# Patient Record
Sex: Female | Born: 1950 | Race: Black or African American | Hispanic: No | Marital: Married | Smoking: Never smoker
Health system: Southern US, Community
[De-identification: ages and names within clinical notes are randomized; demographics above are authoritative.]

## PROBLEM LIST (undated history)

## (undated) DIAGNOSIS — R229 Localized swelling, mass and lump, unspecified: Secondary | ICD-10-CM

## (undated) DIAGNOSIS — E785 Hyperlipidemia, unspecified: Secondary | ICD-10-CM

## (undated) DIAGNOSIS — T8859XA Other complications of anesthesia, initial encounter: Secondary | ICD-10-CM

## (undated) DIAGNOSIS — I10 Essential (primary) hypertension: Secondary | ICD-10-CM

## (undated) DIAGNOSIS — R112 Nausea with vomiting, unspecified: Secondary | ICD-10-CM

## (undated) DIAGNOSIS — R221 Localized swelling, mass and lump, neck: Secondary | ICD-10-CM

## (undated) DIAGNOSIS — T4145XA Adverse effect of unspecified anesthetic, initial encounter: Secondary | ICD-10-CM

## (undated) DIAGNOSIS — Z9889 Other specified postprocedural states: Secondary | ICD-10-CM

## (undated) HISTORY — PX: BREAST EXCISIONAL BIOPSY: SUR124

## (undated) HISTORY — PX: CATARACT EXTRACTION: SUR2

---

## 2001-05-17 ENCOUNTER — Other Ambulatory Visit: Admission: RE | Admit: 2001-05-17 | Discharge: 2001-05-17 | Payer: Self-pay | Admitting: Internal Medicine

## 2001-07-15 ENCOUNTER — Ambulatory Visit (HOSPITAL_COMMUNITY): Admission: RE | Admit: 2001-07-15 | Discharge: 2001-07-15 | Payer: Self-pay | Admitting: Gastroenterology

## 2001-07-15 ENCOUNTER — Encounter (INDEPENDENT_AMBULATORY_CARE_PROVIDER_SITE_OTHER): Payer: Self-pay | Admitting: *Deleted

## 2002-11-02 ENCOUNTER — Emergency Department (HOSPITAL_COMMUNITY): Admission: EM | Admit: 2002-11-02 | Discharge: 2002-11-02 | Payer: Self-pay | Admitting: Emergency Medicine

## 2004-11-24 HISTORY — PX: BUNIONECTOMY: SHX129

## 2005-01-12 ENCOUNTER — Emergency Department (HOSPITAL_COMMUNITY): Admission: EM | Admit: 2005-01-12 | Discharge: 2005-01-12 | Payer: Self-pay | Admitting: Emergency Medicine

## 2005-08-29 ENCOUNTER — Other Ambulatory Visit: Admission: RE | Admit: 2005-08-29 | Discharge: 2005-08-29 | Payer: Self-pay | Admitting: Obstetrics and Gynecology

## 2005-11-24 HISTORY — PX: BREAST LUMPECTOMY: SHX2

## 2006-01-12 ENCOUNTER — Encounter (INDEPENDENT_AMBULATORY_CARE_PROVIDER_SITE_OTHER): Payer: Self-pay | Admitting: Specialist

## 2006-01-12 ENCOUNTER — Encounter: Admission: RE | Admit: 2006-01-12 | Discharge: 2006-01-12 | Payer: Self-pay | Admitting: General Surgery

## 2006-01-12 ENCOUNTER — Ambulatory Visit (HOSPITAL_BASED_OUTPATIENT_CLINIC_OR_DEPARTMENT_OTHER): Admission: RE | Admit: 2006-01-12 | Discharge: 2006-01-12 | Payer: Self-pay | Admitting: General Surgery

## 2006-09-28 ENCOUNTER — Ambulatory Visit (HOSPITAL_COMMUNITY): Admission: RE | Admit: 2006-09-28 | Discharge: 2006-09-28 | Payer: Self-pay | Admitting: Gastroenterology

## 2006-09-28 ENCOUNTER — Encounter (INDEPENDENT_AMBULATORY_CARE_PROVIDER_SITE_OTHER): Payer: Self-pay | Admitting: Specialist

## 2007-05-03 ENCOUNTER — Other Ambulatory Visit: Admission: RE | Admit: 2007-05-03 | Discharge: 2007-05-03 | Payer: Self-pay | Admitting: *Deleted

## 2009-02-05 ENCOUNTER — Other Ambulatory Visit: Admission: RE | Admit: 2009-02-05 | Discharge: 2009-02-05 | Payer: Self-pay | Admitting: Obstetrics and Gynecology

## 2009-02-06 ENCOUNTER — Encounter: Admission: RE | Admit: 2009-02-06 | Discharge: 2009-02-06 | Payer: Self-pay | Admitting: Family Medicine

## 2010-03-12 ENCOUNTER — Encounter: Admission: RE | Admit: 2010-03-12 | Discharge: 2010-03-12 | Payer: Self-pay | Admitting: Family Medicine

## 2011-04-11 NOTE — Op Note (Signed)
[name], Lynn Espinoza               ACCOUNT NO.:  [id number]   MEDICAL RECORD NO.:  [id number]          PATIENT TYPE:  AMB   LOCATION:  DSC                          FACILITY:  [facility]   PHYSICIAN:  Adolph Pollack, M.D.DATE OF BIRTH:  [birth year]   DATE OF PROCEDURE:  01/12/2006  DATE OF DISCHARGE:                                 OPERATIVE REPORT   PREOP DIAGNOSIS:  Nonpalpable right breast mass.   POSTOPERATIVE DIAGNOSIS:  Nonpalpable right breast mass.   PROCEDURE:  Right breast biopsy after needle localization.   SURGEON:  Adolph Pollack, M.D.   ANESTHESIA:  General plus 0.25% plain Marcaine for local.   INDICATION:  [name] is a 60 year old female who has had a stable mass  noted by radiographic studies. It did change a little bit this year, in  that, it got a little larger. She had multiple radiographic studies  including gamma imaging which did not suggest malignancy; however, she was  concerned about the growth; and based on that, I thought, that she had  indication for biopsy; and she now presents for that.   TECHNIQUE:  She had a successful needle localization by Dr. Isaiah Serge.  She  was seen in the holding area, the right chest wall marked with my initials.  She was then brought to the operating room, placed supine on the operating  table, and a general anesthetic was administered. The wire was carefully  cut; cut until it was closer to the skin; and the right breast and wire were  sterilely prepped and draped. There was an X on the skin placed by Dr.  Isaiah Serge which marked where the mass was; and then a curvilinear incision was  made in that area through skin and subcutaneous tissue. The wire was brought  into the incision; I then created flaps circumferentially. As I went deep I  was able to palpate the mass and excise it, sharply. It was close to a deep  margin area; and I excised more of this margin as well. I sent the mass for  specimen mammography; and it  confirmed that, indeed, this was the mass of  concern. I sent the deep margin separately.   Bleeding was controlled with electrocautery. I then injected local,  anesthetic superficially and deep. Once hemostasis was adequate; the  subcutaneous tissue was loosely closed with in interrupted 3-0 Vicryl  sutures; and the skin closed with a 4-0 Monocryl subcuticular stitch  followed by Dermabond and a sterile dressing.   She tolerated the procedure well without any apparent complications; and was  taken to the recovery room in a satisfactory condition. She will be given  discharge instructions and Vicodin for pain. Of note was that her potassium  level, preoperatively, was low at 2.9; and this will be discussed with her;  and a recommendation will be made for her to contact her primary care  physician regarding this.      Adolph Pollack, M.D.  Electronically Signed     TJR/MEDQ  D:  01/12/2006  T:  01/12/2006  Job:  [id number]   cc:  Jasmine Pang  Fax: [phone]   Darius Bump, M.D.  Fax: [phone]

## 2011-04-11 NOTE — Procedures (Signed)
[place]  Patient:    Lynn Espinoza, Lynn Espinoza Visit Number: [id number] MRN: [id number]          Service Type: END Location: ENDO Attending Physician:  Charna Elizabeth Proc. Date: 07/15/01 Adm. Date:  07/15/2001 Disc. Date: 07/15/2001   CC:         Darius Bump, M.D.   Procedure Report  DATE OF BIRTH:  February 09, 1954.  PROCEDURE:  Colonoscopy.  INDICATION FOR PROCEDURE:  Screening colonoscopy in a 60 year old African-American female who has a family history of colon cancer.  Rule out colonic polyps, masses, etc.  PREPROCEDURE PREPARATION:  Informed consent was procured from the patient. The patient was fasted for eight hours prior to the procedure and prepped with a bottle of magnesium citrate and a gallon of NuLytely the night prior to the procedure.  PREPROCEDURE PHYSICAL:  VITAL SIGNS:  The patient had stable vital signs.  NECK:  Supple.  CHEST:  Clear to auscultation.  S1, S2 regular.  ABDOMEN:  Soft with normal bowel sounds.  DESCRIPTION OF PROCEDURE:  The patient was placed in the left lateral decubitus position and sedated with 100 mg of Demerol and 7.5 mg of Versed intravenously.  Once the patient was adequately sedate and maintained on low-flow oxygen and continuous cardiac monitoring, the Olympus video colonoscope was advanced from the rectum to the cecum with slight difficulty as the patient had a very tortuous colon.  There were several small hyperplastic-appearing polyps in the rectum, four or five of which were biopsied for pathology.  No large masses or polyps were seen.  The patient tolerated the procedure well without complications.  IMPRESSION: 1. 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 2. Few hyperplastic-appearing polyps biopsied from rectum. 3. No large masses or polyps seen.  RECOMMENDATIONS: 1. Await pathology results. 2. Repeat colorectal cancer screening in the next five years unless the    patient were to have  any evidence of adenomatous polyps on pathology. 3. A high-fiber diet. 4. Outpatient follow-up p.r.n. Attending Physician:  Charna Elizabeth DD:  07/15/01 TD:  07/17/01 Job: [id number]

## 2011-04-11 NOTE — Op Note (Signed)
Lynn Espinoza, HARL               ACCOUNT NO.:  [id number]   MEDICAL RECORD NO.:  [id number]          PATIENT TYPE:  AMB   LOCATION:  ENDO                         FACILITY:  [facility]   PHYSICIAN:  Anselmo Rod, M.D.  DATE OF BIRTH:  1951-05-15   DATE OF PROCEDURE:  09/28/2006  DATE OF DISCHARGE:                                 OPERATIVE REPORT   PROCEDURE PERFORMED:  Colonoscopy with snare polypectomy x 1 and cold  biopsies x1.   ENDOSCOPIST:  Anselmo Rod, M.D.   INSTRUMENT USED:  Olympus video colonoscope.   INDICATIONS FOR PROCEDURE:  The patient is a 60 year old African-American  female undergoing screening colonoscopy.  Patient has a family history of  colon cancer in her sister and was found to have guaiac positive stools on  her physical exam.  Rule out colonic polyps, masses, etc.   PREPROCEDURE PREPARATION:  Informed consent was procured from the patient.  The patient was fasted for four hours prior to the procedure and prepped  with 20 OsmoPrep pills the night of and 12 Osmoprep pills the morning of the  procedure.  The risks and benefits of the procedure including a 10% miss  rate for cancer or polyps was discussed with the patient as well.   PREPROCEDURE PHYSICAL:  The patient had stable vital signs.  Neck supple.  Chest clear to auscultation.  S1 and S2 regular.  Abdomen soft with normal  bowel sounds.   DESCRIPTION OF PROCEDURE:  The patient was placed in left lateral decubitus  position and sedated with 75 mcg of fentanyl and 7.5 mg of Versed in slow  incremental doses.  Once the patient was adequately sedated and maintained  on low flow oxygen and continuous cardiac monitoring, the Olympus video  colonoscope was advanced from the rectum to the cecum.  Mild melanosis coli  was noted throughout the colonic mucosa.  A small flat polyp was snared from  the cecum (hot snare x1).  A small sessile polyp was biopsied from 80 cm  (cold biopsies x 1).  Small internal  hemorrhoids were seen on retroflexion.  There was no evidence of diverticulosis.  The patient tolerated the  procedure well without immediate complication.  The terminal ileum appeared  normal.   IMPRESSION:  1. Small internal hemorrhoids.  2. Mild melanosis coli noted throughout the colonic mucosa.  3. Small flat polyps snared from the cecum and one biopsied from 80 cm.  4. No evidence of diverticulosis.  5. Normal terminal ileum.   RECOMMENDATIONS:  1. Await pathology results.  2. Repeat colonoscopy depending on pathology results.  3. Avoid all nonsteroidals including aspirin for now.  4. Outpatient followup in the next two weeks, repeat guaiac testing.      Anselmo Rod, M.D.  Electronically Signed     JNM/MEDQ  D:  09/28/2006  T:  09/28/2006  Job:  [id number]   cc:   Darius Bump, M.D.

## 2011-05-14 ENCOUNTER — Other Ambulatory Visit: Payer: Self-pay | Admitting: Family Medicine

## 2011-05-14 DIAGNOSIS — Z1231 Encounter for screening mammogram for malignant neoplasm of breast: Secondary | ICD-10-CM

## 2011-05-21 ENCOUNTER — Ambulatory Visit
Admission: RE | Admit: 2011-05-21 | Discharge: 2011-05-21 | Disposition: A | Discharge status: Home / Self Care | Payer: Self-pay | Source: Ambulatory Visit | Attending: Family Medicine | Admitting: Family Medicine

## 2011-05-21 DIAGNOSIS — Z1231 Encounter for screening mammogram for malignant neoplasm of breast: Secondary | ICD-10-CM

## 2011-05-22 ENCOUNTER — Other Ambulatory Visit: Payer: Self-pay | Admitting: Nurse Practitioner

## 2011-05-22 ENCOUNTER — Other Ambulatory Visit (HOSPITAL_COMMUNITY)
Admission: RE | Admit: 2011-05-22 | Discharge: 2011-05-22 | Disposition: A | Discharge status: Home / Self Care | Payer: Self-pay | Source: Ambulatory Visit | Attending: Obstetrics and Gynecology | Admitting: Obstetrics and Gynecology

## 2011-05-22 DIAGNOSIS — R87810 Cervical high risk human papillomavirus (HPV) DNA test positive: Secondary | ICD-10-CM | POA: Insufficient documentation

## 2011-05-22 DIAGNOSIS — Z01419 Encounter for gynecological examination (general) (routine) without abnormal findings: Secondary | ICD-10-CM | POA: Insufficient documentation

## 2012-07-20 ENCOUNTER — Other Ambulatory Visit: Payer: Self-pay | Admitting: Nurse Practitioner

## 2012-07-20 ENCOUNTER — Other Ambulatory Visit (HOSPITAL_COMMUNITY)
Admission: RE | Admit: 2012-07-20 | Discharge: 2012-07-20 | Disposition: A | Discharge status: Home / Self Care | Payer: 59 | Source: Ambulatory Visit | Attending: Obstetrics and Gynecology | Admitting: Obstetrics and Gynecology

## 2012-07-20 ENCOUNTER — Other Ambulatory Visit: Payer: Self-pay | Admitting: Obstetrics and Gynecology

## 2012-07-20 DIAGNOSIS — N760 Acute vaginitis: Secondary | ICD-10-CM | POA: Insufficient documentation

## 2012-07-20 DIAGNOSIS — Z01419 Encounter for gynecological examination (general) (routine) without abnormal findings: Secondary | ICD-10-CM | POA: Insufficient documentation

## 2012-07-20 DIAGNOSIS — Z1231 Encounter for screening mammogram for malignant neoplasm of breast: Secondary | ICD-10-CM

## 2012-07-28 ENCOUNTER — Ambulatory Visit
Admission: RE | Admit: 2012-07-28 | Discharge: 2012-07-28 | Disposition: A | Discharge status: Home / Self Care | Payer: 59 | Source: Ambulatory Visit | Attending: Obstetrics and Gynecology | Admitting: Obstetrics and Gynecology

## 2012-07-28 DIAGNOSIS — Z1231 Encounter for screening mammogram for malignant neoplasm of breast: Secondary | ICD-10-CM

## 2013-07-22 ENCOUNTER — Other Ambulatory Visit: Payer: Self-pay

## 2013-07-22 DIAGNOSIS — Z1231 Encounter for screening mammogram for malignant neoplasm of breast: Secondary | ICD-10-CM

## 2013-08-09 ENCOUNTER — Ambulatory Visit
Admission: RE | Admit: 2013-08-09 | Discharge: 2013-08-09 | Disposition: A | Discharge status: Home / Self Care | Payer: BC Managed Care – PPO | Source: Ambulatory Visit

## 2013-08-09 DIAGNOSIS — Z1231 Encounter for screening mammogram for malignant neoplasm of breast: Secondary | ICD-10-CM

## 2014-02-06 DIAGNOSIS — M5430 Sciatica, unspecified side: Secondary | ICD-10-CM | POA: Diagnosis not present

## 2014-02-06 DIAGNOSIS — M999 Biomechanical lesion, unspecified: Secondary | ICD-10-CM | POA: Diagnosis not present

## 2014-02-07 DIAGNOSIS — M999 Biomechanical lesion, unspecified: Secondary | ICD-10-CM | POA: Diagnosis not present

## 2014-02-07 DIAGNOSIS — M5430 Sciatica, unspecified side: Secondary | ICD-10-CM | POA: Diagnosis not present

## 2014-02-08 DIAGNOSIS — M5430 Sciatica, unspecified side: Secondary | ICD-10-CM | POA: Diagnosis not present

## 2014-02-08 DIAGNOSIS — M999 Biomechanical lesion, unspecified: Secondary | ICD-10-CM | POA: Diagnosis not present

## 2014-02-13 DIAGNOSIS — M999 Biomechanical lesion, unspecified: Secondary | ICD-10-CM | POA: Diagnosis not present

## 2014-02-13 DIAGNOSIS — M5430 Sciatica, unspecified side: Secondary | ICD-10-CM | POA: Diagnosis not present

## 2014-02-15 DIAGNOSIS — M999 Biomechanical lesion, unspecified: Secondary | ICD-10-CM | POA: Diagnosis not present

## 2014-02-15 DIAGNOSIS — M5430 Sciatica, unspecified side: Secondary | ICD-10-CM | POA: Diagnosis not present

## 2014-02-16 DIAGNOSIS — M999 Biomechanical lesion, unspecified: Secondary | ICD-10-CM | POA: Diagnosis not present

## 2014-02-16 DIAGNOSIS — M5430 Sciatica, unspecified side: Secondary | ICD-10-CM | POA: Diagnosis not present

## 2014-02-22 DIAGNOSIS — M5430 Sciatica, unspecified side: Secondary | ICD-10-CM | POA: Diagnosis not present

## 2014-02-22 DIAGNOSIS — M999 Biomechanical lesion, unspecified: Secondary | ICD-10-CM | POA: Diagnosis not present

## 2014-02-23 DIAGNOSIS — M5430 Sciatica, unspecified side: Secondary | ICD-10-CM | POA: Diagnosis not present

## 2014-02-23 DIAGNOSIS — M999 Biomechanical lesion, unspecified: Secondary | ICD-10-CM | POA: Diagnosis not present

## 2014-02-27 DIAGNOSIS — M999 Biomechanical lesion, unspecified: Secondary | ICD-10-CM | POA: Diagnosis not present

## 2014-02-27 DIAGNOSIS — M5430 Sciatica, unspecified side: Secondary | ICD-10-CM | POA: Diagnosis not present

## 2014-03-01 DIAGNOSIS — M5430 Sciatica, unspecified side: Secondary | ICD-10-CM | POA: Diagnosis not present

## 2014-03-01 DIAGNOSIS — M999 Biomechanical lesion, unspecified: Secondary | ICD-10-CM | POA: Diagnosis not present

## 2014-03-06 DIAGNOSIS — M999 Biomechanical lesion, unspecified: Secondary | ICD-10-CM | POA: Diagnosis not present

## 2014-03-06 DIAGNOSIS — M5430 Sciatica, unspecified side: Secondary | ICD-10-CM | POA: Diagnosis not present

## 2014-03-09 DIAGNOSIS — M5430 Sciatica, unspecified side: Secondary | ICD-10-CM | POA: Diagnosis not present

## 2014-03-09 DIAGNOSIS — M999 Biomechanical lesion, unspecified: Secondary | ICD-10-CM | POA: Diagnosis not present

## 2014-03-13 DIAGNOSIS — M5430 Sciatica, unspecified side: Secondary | ICD-10-CM | POA: Diagnosis not present

## 2014-03-13 DIAGNOSIS — M999 Biomechanical lesion, unspecified: Secondary | ICD-10-CM | POA: Diagnosis not present

## 2014-03-16 DIAGNOSIS — I10 Essential (primary) hypertension: Secondary | ICD-10-CM | POA: Diagnosis not present

## 2014-03-16 DIAGNOSIS — E782 Mixed hyperlipidemia: Secondary | ICD-10-CM | POA: Diagnosis not present

## 2014-03-16 DIAGNOSIS — Z23 Encounter for immunization: Secondary | ICD-10-CM | POA: Diagnosis not present

## 2014-03-16 DIAGNOSIS — M5430 Sciatica, unspecified side: Secondary | ICD-10-CM | POA: Diagnosis not present

## 2014-03-16 DIAGNOSIS — M999 Biomechanical lesion, unspecified: Secondary | ICD-10-CM | POA: Diagnosis not present

## 2014-03-20 DIAGNOSIS — M999 Biomechanical lesion, unspecified: Secondary | ICD-10-CM | POA: Diagnosis not present

## 2014-03-20 DIAGNOSIS — M5430 Sciatica, unspecified side: Secondary | ICD-10-CM | POA: Diagnosis not present

## 2014-03-23 DIAGNOSIS — M999 Biomechanical lesion, unspecified: Secondary | ICD-10-CM | POA: Diagnosis not present

## 2014-03-23 DIAGNOSIS — M5430 Sciatica, unspecified side: Secondary | ICD-10-CM | POA: Diagnosis not present

## 2014-04-20 DIAGNOSIS — M25569 Pain in unspecified knee: Secondary | ICD-10-CM | POA: Diagnosis not present

## 2014-04-20 DIAGNOSIS — M1710 Unilateral primary osteoarthritis, unspecified knee: Secondary | ICD-10-CM | POA: Diagnosis not present

## 2014-09-01 ENCOUNTER — Other Ambulatory Visit: Payer: Self-pay

## 2014-09-01 DIAGNOSIS — L723 Sebaceous cyst: Secondary | ICD-10-CM | POA: Diagnosis not present

## 2014-09-01 DIAGNOSIS — Z1239 Encounter for other screening for malignant neoplasm of breast: Secondary | ICD-10-CM

## 2014-09-01 DIAGNOSIS — Z0000 Encounter for general adult medical examination without abnormal findings: Secondary | ICD-10-CM | POA: Diagnosis not present

## 2014-09-01 DIAGNOSIS — E782 Mixed hyperlipidemia: Secondary | ICD-10-CM | POA: Diagnosis not present

## 2014-09-01 DIAGNOSIS — Z23 Encounter for immunization: Secondary | ICD-10-CM | POA: Diagnosis not present

## 2014-09-01 DIAGNOSIS — I10 Essential (primary) hypertension: Secondary | ICD-10-CM | POA: Diagnosis not present

## 2014-09-01 DIAGNOSIS — H6123 Impacted cerumen, bilateral: Secondary | ICD-10-CM | POA: Diagnosis not present

## 2014-09-01 DIAGNOSIS — J309 Allergic rhinitis, unspecified: Secondary | ICD-10-CM | POA: Diagnosis not present

## 2014-09-04 DIAGNOSIS — H25043 Posterior subcapsular polar age-related cataract, bilateral: Secondary | ICD-10-CM | POA: Diagnosis not present

## 2014-09-04 DIAGNOSIS — H2513 Age-related nuclear cataract, bilateral: Secondary | ICD-10-CM | POA: Diagnosis not present

## 2014-09-13 DIAGNOSIS — H25041 Posterior subcapsular polar age-related cataract, right eye: Secondary | ICD-10-CM | POA: Diagnosis not present

## 2014-09-20 DIAGNOSIS — H2511 Age-related nuclear cataract, right eye: Secondary | ICD-10-CM | POA: Diagnosis not present

## 2014-09-20 DIAGNOSIS — H25041 Posterior subcapsular polar age-related cataract, right eye: Secondary | ICD-10-CM | POA: Diagnosis not present

## 2014-09-21 DIAGNOSIS — H25042 Posterior subcapsular polar age-related cataract, left eye: Secondary | ICD-10-CM | POA: Diagnosis not present

## 2014-09-27 ENCOUNTER — Ambulatory Visit: Payer: BC Managed Care – PPO

## 2014-09-27 DIAGNOSIS — H2512 Age-related nuclear cataract, left eye: Secondary | ICD-10-CM | POA: Diagnosis not present

## 2014-09-27 DIAGNOSIS — H25042 Posterior subcapsular polar age-related cataract, left eye: Secondary | ICD-10-CM | POA: Diagnosis not present

## 2014-12-01 ENCOUNTER — Ambulatory Visit
Admission: RE | Admit: 2014-12-01 | Discharge: 2014-12-01 | Disposition: A | Discharge status: Home / Self Care | Payer: Medicare Other | Source: Ambulatory Visit

## 2014-12-01 DIAGNOSIS — Z1239 Encounter for other screening for malignant neoplasm of breast: Secondary | ICD-10-CM

## 2014-12-01 DIAGNOSIS — Z1231 Encounter for screening mammogram for malignant neoplasm of breast: Secondary | ICD-10-CM | POA: Diagnosis not present

## 2015-02-02 DIAGNOSIS — R229 Localized swelling, mass and lump, unspecified: Secondary | ICD-10-CM | POA: Diagnosis not present

## 2015-02-05 DIAGNOSIS — Z961 Presence of intraocular lens: Secondary | ICD-10-CM | POA: Diagnosis not present

## 2015-02-06 ENCOUNTER — Other Ambulatory Visit: Payer: Self-pay | Admitting: Family Medicine

## 2015-02-06 DIAGNOSIS — R229 Localized swelling, mass and lump, unspecified: Principal | ICD-10-CM

## 2015-02-06 DIAGNOSIS — IMO0002 Reserved for concepts with insufficient information to code with codable children: Secondary | ICD-10-CM

## 2015-02-07 ENCOUNTER — Ambulatory Visit
Admission: RE | Admit: 2015-02-07 | Discharge: 2015-02-07 | Disposition: A | Discharge status: Home / Self Care | Payer: Medicare Other | Source: Ambulatory Visit | Attending: Family Medicine | Admitting: Family Medicine

## 2015-02-07 ENCOUNTER — Other Ambulatory Visit: Payer: Self-pay | Admitting: Family Medicine

## 2015-02-07 DIAGNOSIS — R221 Localized swelling, mass and lump, neck: Secondary | ICD-10-CM | POA: Diagnosis not present

## 2015-02-07 DIAGNOSIS — R229 Localized swelling, mass and lump, unspecified: Principal | ICD-10-CM

## 2015-02-07 DIAGNOSIS — IMO0002 Reserved for concepts with insufficient information to code with codable children: Secondary | ICD-10-CM

## 2015-03-01 DIAGNOSIS — I10 Essential (primary) hypertension: Secondary | ICD-10-CM | POA: Diagnosis not present

## 2015-03-01 DIAGNOSIS — E782 Mixed hyperlipidemia: Secondary | ICD-10-CM | POA: Diagnosis not present

## 2015-03-23 ENCOUNTER — Other Ambulatory Visit (INDEPENDENT_AMBULATORY_CARE_PROVIDER_SITE_OTHER): Payer: Self-pay

## 2015-03-23 DIAGNOSIS — R222 Localized swelling, mass and lump, trunk: Secondary | ICD-10-CM | POA: Diagnosis not present

## 2015-03-26 ENCOUNTER — Other Ambulatory Visit: Payer: No Typology Code available for payment source

## 2015-03-28 ENCOUNTER — Ambulatory Visit
Admission: RE | Admit: 2015-03-28 | Discharge: 2015-03-28 | Disposition: A | Discharge status: Home / Self Care | Payer: Medicare Other | Source: Ambulatory Visit | Attending: Surgery | Admitting: Surgery

## 2015-03-28 DIAGNOSIS — R222 Localized swelling, mass and lump, trunk: Secondary | ICD-10-CM | POA: Diagnosis not present

## 2015-03-28 MED ORDER — IOPAMIDOL (ISOVUE-300) INJECTION 61%
75.0000 mL | Freq: Once | INTRAVENOUS | Status: AC | PRN
  Administered 2015-03-28: 75 mL via INTRAVENOUS

## 2015-04-19 ENCOUNTER — Ambulatory Visit: Payer: Self-pay | Admitting: Surgery

## 2015-04-19 DIAGNOSIS — D1721 Benign lipomatous neoplasm of skin and subcutaneous tissue of right arm: Secondary | ICD-10-CM | POA: Diagnosis not present

## 2015-04-19 NOTE — H&P (Signed)
Chief Complaint:  Right supraclavicular mass.    History of Present Illness:  Lynn Espinoza is an 64 y.o. female who has noticed a mass swelling in the right supraclavicular area since the winter.  CT and ultrasound show an ill-defined mass probably a lipoma in the supraclavicular area.  Informed consent obtained in the office regarding the risks inherent to this surgery including brachial plexus injury and she wants to have this mass removed  No past medical history on file.  No past surgical history on file.  No current outpatient prescriptions on file.   No current facility-administered medications for this visit.   Latex No family history on file. Social History:   has no tobacco, alcohol, and drug history on file.   REVIEW OF SYSTEMS : Negative except for see problem list  Physical Exam:   There were no vitals taken for this visit. There is no height or weight on file to calculate BMI.  Gen:  WDWN AAF NAD  Neurological: Alert and oriented to person, place, and time. Motor and sensory function is grossly intact  Head: Normocephalic and atraumatic.  Eyes: Conjunctivae are normal. Pupils are equal, round, and reactive to light. No scleral icterus.  Neck: Normal range of motion. Neck supple. No tracheal deviation or thyromegaly present.  Cardiovascular:  SR without murmurs or gallops.  No carotid bruits  Right supraclavicular rubbery mass, not fixed consistent with lipoma Breast:  Not examined Respiratory: Effort normal.  No respiratory distress. No chest wall tenderness. Breath sounds normal.  No wheezes, rales or rhonchi.  Abdomen:  Not examined GU:  Not examined Musculoskeletal: Normal range of motion. Extremities are nontender. No cyanosis, edema or clubbing noted Lymphadenopathy: No cervical, preauricular, postauricular or axillary adenopathy is present Skin: Skin is warm and dry. No rash noted. No diaphoresis. No erythema. No pallor. Pscyh: Normal mood and affect.  Behavior is normal. Judgment and thought content normal.   LABORATORY RESULTS: No results found for this or any previous visit (from the past 48 hour(s)).   RADIOLOGY RESULTS: No results found.  Problem List: There are no active problems to display for this patient.   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 Hassell Done, MD, [facility] [phone] beeper [phone]  04/19/2015 12:40 PM

## 2015-05-18 ENCOUNTER — Encounter (HOSPITAL_BASED_OUTPATIENT_CLINIC_OR_DEPARTMENT_OTHER)
Admission: RE | Admit: 2015-05-18 | Discharge: 2015-05-18 | Disposition: A | Discharge status: Home / Self Care | Payer: Medicare Other | Source: Ambulatory Visit | Attending: Surgery | Admitting: Surgery

## 2015-05-18 ENCOUNTER — Encounter (HOSPITAL_BASED_OUTPATIENT_CLINIC_OR_DEPARTMENT_OTHER): Payer: Self-pay | Admitting: *Deleted

## 2015-05-18 DIAGNOSIS — D171 Benign lipomatous neoplasm of skin and subcutaneous tissue of trunk: Secondary | ICD-10-CM | POA: Diagnosis present

## 2015-05-18 DIAGNOSIS — D170 Benign lipomatous neoplasm of skin and subcutaneous tissue of head, face and neck: Secondary | ICD-10-CM | POA: Diagnosis not present

## 2015-05-18 DIAGNOSIS — I10 Essential (primary) hypertension: Secondary | ICD-10-CM | POA: Diagnosis not present

## 2015-05-18 LAB — BASIC METABOLIC PANEL
Anion gap: 9 (ref 5–15)
BUN: 13 mg/dL (ref 6–20)
CHLORIDE: 104 mmol/L (ref 101–111)
CO2: 27 mmol/L (ref 22–32)
Calcium: 9.3 mg/dL (ref 8.9–10.3)
Creatinine, Ser: 0.75 mg/dL (ref 0.44–1.00)
GFR calc Af Amer: >60 mL/min (ref >60)
GFR calc non Af Amer: >60 mL/min (ref >60)
Glucose, Bld: 126 mg/dL — ABNORMAL HIGH (ref 65–99)
Potassium: 3.3 mmol/L — ABNORMAL LOW (ref 3.5–5.1)
Sodium: 140 mmol/L (ref 135–145)

## 2015-05-18 NOTE — Progress Notes (Signed)
Pt has Potassium of 3.3 on BMP pre op lab values. Please see labs for specifics.  Dr Kalman Shan called and reported value for Kt and asked if surgeon should be called on value.  He states no, that's ok, very close to WNL.

## 2015-05-21 NOTE — H&P (Signed)
Chief Complaint: Right supraclavicular mass.   History of Present Illness: Lynn Espinoza is an 64 y.o. female who has noticed a mass swelling in the right supraclavicular area since the winter. CT and ultrasound show an ill-defined mass probably a lipoma in the supraclavicular area. Informed consent obtained in the office regarding the risks inherent to this surgery including brachial plexus injury and she wants to have this mass removed  No past medical history on file.  No past surgical history on file.  No current outpatient prescriptions on file.   No current facility-administered medications for this visit.   Latex No family history on file. Social History:  has no tobacco, alcohol, and drug history on file.   REVIEW OF SYSTEMS : Negative except for see problem list  Physical Exam:  There were no vitals taken for this visit. There is no height or weight on file to calculate BMI.  Gen: WDWN AAF NAD  Neurological: Alert and oriented to person, place, and time. Motor and sensory function is grossly intact  Head: Normocephalic and atraumatic.  Eyes: Conjunctivae are normal. Pupils are equal, round, and reactive to light. No scleral icterus.  Neck: Normal range of motion. Neck supple. No tracheal deviation or thyromegaly present.  Cardiovascular: SR without murmurs or gallops. No carotid bruits Right supraclavicular rubbery mass, not fixed consistent with lipoma Breast: Not examined Respiratory: Effort normal. No respiratory distress. No chest wall tenderness. Breath sounds normal. No wheezes, rales or rhonchi.  Abdomen: Not examined GU: Not examined Musculoskeletal: Normal range of motion. Extremities are nontender. No cyanosis, edema or clubbing noted Lymphadenopathy: No cervical, preauricular, postauricular or axillary adenopathy is present Skin: Skin is warm and dry. No rash noted. No diaphoresis. No erythema. No pallor. Pscyh: Normal mood and  affect. Behavior is normal. Judgment and thought content normal.   LABORATORY RESULTS:  Lab Results Last 48 Hours    No results found for this or any previous visit (from the past 48 hour(s)).     RADIOLOGY RESULTS:  Imaging Results (Last 48 hours)    No results found.    Problem List: There are no active problems to display for this patient.   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 Hassell Done, MD, [facility] [phone] beeper [phone]

## 2015-05-22 ENCOUNTER — Ambulatory Visit (HOSPITAL_BASED_OUTPATIENT_CLINIC_OR_DEPARTMENT_OTHER): Payer: Medicare Other | Admitting: Certified Registered"

## 2015-05-22 ENCOUNTER — Encounter (HOSPITAL_BASED_OUTPATIENT_CLINIC_OR_DEPARTMENT_OTHER): Payer: Self-pay | Admitting: *Deleted

## 2015-05-22 ENCOUNTER — Encounter (HOSPITAL_BASED_OUTPATIENT_CLINIC_OR_DEPARTMENT_OTHER)
Admission: RE | Disposition: A | Discharge status: Home / Self Care | Payer: Self-pay | Source: Ambulatory Visit | Attending: Surgery

## 2015-05-22 ENCOUNTER — Ambulatory Visit (HOSPITAL_BASED_OUTPATIENT_CLINIC_OR_DEPARTMENT_OTHER)
Admission: RE | Admit: 2015-05-22 | Discharge: 2015-05-22 | Disposition: A | Discharge status: Home / Self Care | Payer: Medicare Other | Source: Ambulatory Visit | Attending: Surgery | Admitting: Surgery

## 2015-05-22 DIAGNOSIS — I10 Essential (primary) hypertension: Secondary | ICD-10-CM | POA: Insufficient documentation

## 2015-05-22 DIAGNOSIS — D170 Benign lipomatous neoplasm of skin and subcutaneous tissue of head, face and neck: Secondary | ICD-10-CM | POA: Diagnosis not present

## 2015-05-22 DIAGNOSIS — M7989 Other specified soft tissue disorders: Secondary | ICD-10-CM | POA: Diagnosis not present

## 2015-05-22 DIAGNOSIS — D1739 Benign lipomatous neoplasm of skin and subcutaneous tissue of other sites: Secondary | ICD-10-CM | POA: Diagnosis not present

## 2015-05-22 DIAGNOSIS — R222 Localized swelling, mass and lump, trunk: Secondary | ICD-10-CM | POA: Diagnosis not present

## 2015-05-22 HISTORY — DX: Essential (primary) hypertension: I10

## 2015-05-22 HISTORY — PX: MASS EXCISION: SHX2000

## 2015-05-22 HISTORY — DX: Hyperlipidemia, unspecified: E78.5

## 2015-05-22 HISTORY — DX: Localized swelling, mass and lump, unspecified: R22.9

## 2015-05-22 LAB — POCT HEMOGLOBIN-HEMACUE: Hemoglobin: 13.9 g/dL (ref 12.0–15.0)

## 2015-05-22 SURGERY — EXCISION MASS
Anesthesia: General | Site: Chest | Laterality: Right

## 2015-05-22 MED ORDER — LACTATED RINGERS IV SOLN
INTRAVENOUS | Status: DC
  Administered 2015-05-22 (×2): via INTRAVENOUS

## 2015-05-22 MED ORDER — SUFENTANIL CITRATE 50 MCG/ML IV SOLN
INTRAVENOUS | Status: DC | PRN
  Administered 2015-05-22 (×2): 5 ug via INTRAVENOUS

## 2015-05-22 MED ORDER — LABETALOL HCL 5 MG/ML IV SOLN
5.0000 mg | Freq: Once | INTRAVENOUS | Status: AC
  Administered 2015-05-22: 13:00:00 via INTRAVENOUS

## 2015-05-22 MED ORDER — CEFAZOLIN SODIUM-DEXTROSE 2-3 GM-% IV SOLR
INTRAVENOUS | Status: AC
  Filled 2015-05-22: qty 50

## 2015-05-22 MED ORDER — MIDAZOLAM HCL 2 MG/2ML IJ SOLN
INTRAMUSCULAR | Status: AC
Start: 2015-05-22 — End: 2015-05-22
  Filled 2015-05-22: qty 2

## 2015-05-22 MED ORDER — CEFAZOLIN SODIUM-DEXTROSE 2-3 GM-% IV SOLR
2.0000 g | INTRAVENOUS | Status: AC
  Administered 2015-05-22: 2 g via INTRAVENOUS

## 2015-05-22 MED ORDER — PROPOFOL 10 MG/ML IV BOLUS
INTRAVENOUS | Status: DC | PRN
  Administered 2015-05-22: 200 mg via INTRAVENOUS

## 2015-05-22 MED ORDER — ONDANSETRON HCL 4 MG/2ML IJ SOLN
INTRAMUSCULAR | Status: DC | PRN
  Administered 2015-05-22: 4 mg via INTRAVENOUS

## 2015-05-22 MED ORDER — BUPIVACAINE-EPINEPHRINE (PF) 0.5% -1:200000 IJ SOLN
INTRAMUSCULAR | Status: AC
  Filled 2015-05-22: qty 30

## 2015-05-22 MED ORDER — FENTANYL CITRATE (PF) 100 MCG/2ML IJ SOLN: 50.0000 ug | INTRAMUSCULAR | Status: DC | PRN

## 2015-05-22 MED ORDER — SUFENTANIL CITRATE 50 MCG/ML IV SOLN
INTRAVENOUS | Status: AC
  Filled 2015-05-22: qty 1

## 2015-05-22 MED ORDER — SUCCINYLCHOLINE CHLORIDE 20 MG/ML IJ SOLN
INTRAMUSCULAR | Status: AC
  Filled 2015-05-22: qty 1

## 2015-05-22 MED ORDER — HYDROCODONE-ACETAMINOPHEN 5-325 MG PO TABS: 1.0000 | ORAL_TABLET | ORAL | Status: AC | PRN

## 2015-05-22 MED ORDER — SCOPOLAMINE 1 MG/3DAYS TD PT72: 1.0000 | MEDICATED_PATCH | Freq: Once | TRANSDERMAL | Status: DC | PRN

## 2015-05-22 MED ORDER — LABETALOL HCL 5 MG/ML IV SOLN
5.0000 mg | Freq: Once | INTRAVENOUS | Status: AC
  Administered 2015-05-22: 5 mg via INTRAVENOUS

## 2015-05-22 MED ORDER — LIDOCAINE HCL (PF) 1 % IJ SOLN
INTRAMUSCULAR | Status: AC
  Filled 2015-05-22: qty 30

## 2015-05-22 MED ORDER — BUPIVACAINE HCL (PF) 0.5 % IJ SOLN
INTRAMUSCULAR | Status: AC
  Filled 2015-05-22: qty 30

## 2015-05-22 MED ORDER — FENTANYL CITRATE (PF) 100 MCG/2ML IJ SOLN
INTRAMUSCULAR | Status: AC
  Filled 2015-05-22: qty 6

## 2015-05-22 MED ORDER — GLYCOPYRROLATE 0.2 MG/ML IJ SOLN: 0.2000 mg | Freq: Once | INTRAMUSCULAR | Status: DC | PRN

## 2015-05-22 MED ORDER — MIDAZOLAM HCL 2 MG/2ML IJ SOLN
1.0000 mg | INTRAMUSCULAR | Status: DC | PRN
  Administered 2015-05-22: 2 mg via INTRAVENOUS

## 2015-05-22 MED ORDER — LIDOCAINE-EPINEPHRINE (PF) 1 %-1:200000 IJ SOLN
INTRAMUSCULAR | Status: AC
  Filled 2015-05-22: qty 10

## 2015-05-22 MED ORDER — HEPARIN SODIUM (PORCINE) 5000 UNIT/ML IJ SOLN: 5000.0000 [IU] | Freq: Once | INTRAMUSCULAR | Status: DC

## 2015-05-22 MED ORDER — LABETALOL HCL 5 MG/ML IV SOLN
INTRAVENOUS | Status: AC
  Filled 2015-05-22: qty 4

## 2015-05-22 MED ORDER — DEXAMETHASONE SODIUM PHOSPHATE 4 MG/ML IJ SOLN
INTRAMUSCULAR | Status: DC | PRN
  Administered 2015-05-22: 10 mg via INTRAVENOUS

## 2015-05-22 SURGICAL SUPPLY — 55 items
APL SKNCLS STERI-STRIP NONHPOA (GAUZE/BANDAGES/DRESSINGS)
BENZOIN TINCTURE PRP APPL 2/3 (GAUZE/BANDAGES/DRESSINGS) IMPLANT
BLADE CLIPPER SURG (BLADE) IMPLANT
BLADE SURG 15 STRL LF DISP TIS (BLADE) ×1 IMPLANT
BLADE SURG 15 STRL SS (BLADE) ×3
CANISTER SUCT 1200ML W/VALVE (MISCELLANEOUS) ×2 IMPLANT
CLEANER CAUTERY TIP 5X5 PAD (MISCELLANEOUS) ×1 IMPLANT
CLOSURE WOUND 1/2 X4 (GAUZE/BANDAGES/DRESSINGS)
COVER BACK TABLE 60X90IN (DRAPES) ×3 IMPLANT
COVER MAYO STAND STRL (DRAPES) ×3 IMPLANT
DECANTER SPIKE VIAL GLASS SM (MISCELLANEOUS) IMPLANT
DRAPE LAPAROTOMY 100X72 PEDS (DRAPES) ×2 IMPLANT
DRAPE U-SHAPE 76X120 STRL (DRAPES) IMPLANT
DRSG TEGADERM 2-3/8X2-3/4 SM (GAUZE/BANDAGES/DRESSINGS) IMPLANT
ELECT REM PT RETURN 9FT ADLT (ELECTROSURGICAL) ×3
ELECTRODE REM PT RTRN 9FT ADLT (ELECTROSURGICAL) ×1 IMPLANT
GLOVE BIO SURGEON STRL SZ8 (GLOVE) ×1 IMPLANT
GLOVE BIOGEL PI IND STRL 7.0 (GLOVE) IMPLANT
GLOVE BIOGEL PI IND STRL 8 (GLOVE) IMPLANT
GLOVE BIOGEL PI INDICATOR 7.0 (GLOVE) ×4
GLOVE BIOGEL PI INDICATOR 8 (GLOVE) ×4
GLOVE SURG SS PI 7.0 STRL IVOR (GLOVE) ×2 IMPLANT
GLOVE SURG SS PI 8.0 STRL IVOR (GLOVE) ×6 IMPLANT
GOWN STRL REUS W/ TWL LRG LVL3 (GOWN DISPOSABLE) ×1 IMPLANT
GOWN STRL REUS W/ TWL XL LVL3 (GOWN DISPOSABLE) ×1 IMPLANT
GOWN STRL REUS W/TWL LRG LVL3 (GOWN DISPOSABLE) ×3
GOWN STRL REUS W/TWL XL LVL3 (GOWN DISPOSABLE) ×6
LIQUID BAND (GAUZE/BANDAGES/DRESSINGS) IMPLANT
NDL HYPO 25X1 1.5 SAFETY (NEEDLE) ×1 IMPLANT
NDL PRECISIONGLIDE 27X1.5 (NEEDLE) IMPLANT
NEEDLE HYPO 25X1 1.5 SAFETY (NEEDLE) ×3 IMPLANT
NEEDLE PRECISIONGLIDE 27X1.5 (NEEDLE) IMPLANT
NS IRRIG 1000ML POUR BTL (IV SOLUTION) ×2 IMPLANT
PACK BASIN DAY SURGERY FS (CUSTOM PROCEDURE TRAY) ×3 IMPLANT
PAD CLEANER CAUTERY TIP 5X5 (MISCELLANEOUS) ×2
PENCIL BUTTON HOLSTER BLD 10FT (ELECTRODE) ×3 IMPLANT
SPONGE GAUZE 2X2 8PLY STER LF (GAUZE/BANDAGES/DRESSINGS)
SPONGE GAUZE 2X2 8PLY STRL LF (GAUZE/BANDAGES/DRESSINGS) ×2 IMPLANT
SPONGE GAUZE 4X4 12PLY STER LF (GAUZE/BANDAGES/DRESSINGS) ×1 IMPLANT
STRIP CLOSURE SKIN 1/2X4 (GAUZE/BANDAGES/DRESSINGS) IMPLANT
SUT ETHILON 3 0 FSL (SUTURE) IMPLANT
SUT ETHILON 5 0 PS 2 18 (SUTURE) IMPLANT
SUT VIC AB 3-0 SH 27 (SUTURE)
SUT VIC AB 3-0 SH 27X BRD (SUTURE) IMPLANT
SUT VIC AB 4-0 SH 18 (SUTURE) ×3 IMPLANT
SUT VIC AB 5-0 PS2 18 (SUTURE) IMPLANT
SUT VICRYL 3-0 CR8 SH (SUTURE) IMPLANT
SYR BULB 3OZ (MISCELLANEOUS) ×3 IMPLANT
SYR CONTROL 10ML LL (SYRINGE) ×3 IMPLANT
TOWEL OR 17X24 6PK STRL BLUE (TOWEL DISPOSABLE) ×3 IMPLANT
TRAY DSU PREP LF (CUSTOM PROCEDURE TRAY) ×3 IMPLANT
TUBE CONNECTING 20'X1/4 (TUBING) ×1
TUBE CONNECTING 20X1/4 (TUBING) ×1 IMPLANT
UNDERPAD 30X30 (UNDERPADS AND DIAPERS) IMPLANT
YANKAUER SUCT BULB TIP NO VENT (SUCTIONS) ×2 IMPLANT

## 2015-05-22 NOTE — Anesthesia Postprocedure Evaluation (Signed)
  Anesthesia Post-op Note  Patient: Lynn Espinoza  Procedure(s) Performed: Procedure(s): EXCISION RIGHT SUPRACLAVICULAR  MASS (Right)  Patient Location: PACU  Anesthesia Type:General  Level of Consciousness: awake  Airway and Oxygen Therapy: Patient Spontanous Breathing  Post-op Pain: mild  Post-op Assessment: Post-op Vital signs reviewed              Post-op Vital Signs: Reviewed  Last Vitals:  Filed Vitals:   05/22/15 1240  BP:   Pulse: 72  Temp:   Resp: 20    Complications: No apparent anesthesia complications

## 2015-05-22 NOTE — Anesthesia Procedure Notes (Signed)
Procedure Name: LMA Insertion Date/Time: 05/22/2015 11:25 AM Performed by: Melynda Ripple D Pre-anesthesia Checklist: Patient identified, Emergency Drugs available, Suction available and Patient being monitored Patient Re-evaluated:Patient Re-evaluated prior to inductionOxygen Delivery Method: Circle System Utilized Preoxygenation: Pre-oxygenation with 100% oxygen Intubation Type: IV induction Ventilation: Mask ventilation without difficulty LMA: LMA inserted LMA Size: 4.0 Number of attempts: 1 Airway Equipment and Method: Bite block Placement Confirmation: positive ETCO2 Tube secured with: Tape Dental Injury: Teeth and Oropharynx as per pre-operative assessment

## 2015-05-22 NOTE — Discharge Instructions (Addendum)
°  Post Anesthesia Home Care Instructions  Activity: Get plenty of rest for the remainder of the day. A responsible adult should stay with you for 24 hours following the procedure.  For the next 24 hours, DO NOT: -Drive a car -Paediatric nurse -Drink alcoholic beverages -Take any medication unless instructed by your physician -Make any legal decisions or sign important papers.  Meals: Start with liquid foods such as gelatin or soup. Progress to regular foods as tolerated. Avoid greasy, spicy, heavy foods. If nausea and/or vomiting occur, drink only clear liquids until the nausea and/or vomiting subsides. Call your physician if vomiting continues.  Special Instructions/Symptoms: Your throat may feel dry or sore from the anesthesia or the breathing tube placed in your throat during surgery. If this causes discomfort, gargle with warm salt water. The discomfort should disappear within 24 hours.  If you had a scopolamine patch placed behind your ear for the management of post- operative nausea and/or vomiting:  1. The medication in the patch is effective for 72 hours, after which it should be removed.  Wrap patch in a tissue and discard in the trash. Wash hands thoroughly with soap and water. 2. You may remove the patch earlier than 72 hours if you experience unpleasant side effects which may include dry mouth, dizziness or visual disturbances. 3. Avoid touching the patch. Wash your hands with soap and water after contact with the patch.   Call your surgeon if you experience:   1.  Fever over 101.0. 2.  Inability to urinate. 3.  Nausea and/or vomiting. 4.  Extreme swelling or bruising at the surgical site. 5.  Continued bleeding from the incision. 6.  Increased pain, redness or drainage from the incision. 7.  Problems related to your pain medication. 8. Any change in color, movement and/or sensation 9. Any problems and/or concerns   May shower tomorrow morning  Post Anesthesia Home  Care Instructions  Activity: Get plenty of rest for the remainder of the day. A responsible adult should stay with you for 24 hours following the procedure.  For the next 24 hours, DO NOT: -Drive a car -Paediatric nurse -Drink alcoholic beverages -Take any medication unless instructed by your physician -Make any legal decisions or sign important papers.  Meals: Start with liquid foods such as gelatin or soup. Progress to regular foods as tolerated. Avoid greasy, spicy, heavy foods. If nausea and/or vomiting occur, drink only clear liquids until the nausea and/or vomiting subsides. Call your physician if vomiting continues.  Special Instructions/Symptoms: Your throat may feel dry or sore from the anesthesia or the breathing tube placed in your throat during surgery. If this causes discomfort, gargle with warm salt water. The discomfort should disappear within 24 hours.  If you had a scopolamine patch placed behind your ear for the management of post- operative nausea and/or vomiting:  1. The medication in the patch is effective for 72 hours, after which it should be removed.  Wrap patch in a tissue and discard in the trash. Wash hands thoroughly with soap and water. 2. You may remove the patch earlier than 72 hours if you experience unpleasant side effects which may include dry mouth, dizziness or visual disturbances. 3. Avoid touching the patch. Wash your hands with soap and water after contact with the patch.

## 2015-05-22 NOTE — Transfer of Care (Signed)
Immediate Anesthesia Transfer of Care Note  Patient: Lynn Espinoza  Procedure(s) Performed: Procedure(s): EXCISION RIGHT SUPRACLAVICULAR  MASS (Right)  Patient Location: PACU  Anesthesia Type:General  Level of Consciousness: awake, alert  and oriented  Airway & Oxygen Therapy: Patient Spontanous Breathing and Patient connected to face mask oxygen  Post-op Assessment: Report given to RN and Post -op Vital signs reviewed and stable  Post vital signs: Reviewed and stable  Last Vitals:  Filed Vitals:   05/22/15 1210  BP:   Pulse: 87  Temp:   Resp:     Complications: No apparent anesthesia complications

## 2015-05-22 NOTE — Progress Notes (Signed)
Labetalol 5mg  IV per MD order.

## 2015-05-22 NOTE — Op Note (Signed)
Surgeon: Kaylyn Lim, MD, FACS  Asst:  none  Anes:  General by LMA  Procedure: Excision of right supraclavicular mass (lipoma)  Diagnosis: Lipoma path pending  Complications: None noted  EBL:   minimal cc  Drains: none  Description of Procedure:  The patient was taken to OR 6 at [facility].  After anesthesia was administered and the patient was prepped a timeout was performed.  The previously marked area in the right supraclavicular area was incised and carried through the platysma.  The lipoma capsule was encountered and used to perform gentle digital dissection to remove it from the clavicle and deep spaces.  No neurovascular structures were encountered.  The Bovie was not used and the dissection maintained hemostasis.  The wound was irrigated and closed with 4-0 vicryl and Liquiban.  No Marcaine was injected  The patient tolerated the procedure well and was taken to the PACU in stable condition.     Matt B. Hassell Done, [place], [facility] [place]

## 2015-05-22 NOTE — Anesthesia Preprocedure Evaluation (Signed)
Anesthesia Evaluation  Patient identified by MRN, date of birth, ID band Patient awake    Reviewed: Allergy & Precautions, NPO status , Patient's Chart, lab work & pertinent test results  Airway Mallampati: II  TM Distance: >3 FB Neck ROM: Full    Dental   Pulmonary neg pulmonary ROS,  breath sounds clear to auscultation        Cardiovascular hypertension, Rhythm:Regular Rate:Normal     Neuro/Psych    GI/Hepatic negative GI ROS, Neg liver ROS,   Endo/Other  negative endocrine ROS  Renal/GU negative Renal ROS     Musculoskeletal   Abdominal   Peds  Hematology   Anesthesia Other Findings   Reproductive/Obstetrics                             Anesthesia Physical Anesthesia Plan  ASA: III  Anesthesia Plan: General   Post-op Pain Management:    Induction: Intravenous  Airway Management Planned: LMA  Additional Equipment:   Intra-op Plan:   Post-operative Plan: Extubation in OR  Informed Consent: I have reviewed the patients History and Physical, chart, labs and discussed the procedure including the risks, benefits and alternatives for the proposed anesthesia with the patient or authorized representative who has indicated his/her understanding and acceptance.   Dental advisory given  Plan Discussed with: CRNA, Anesthesiologist and Surgeon  Anesthesia Plan Comments:         Anesthesia Quick Evaluation

## 2015-05-22 NOTE — Interval H&P Note (Signed)
History and Physical Interval Note:  05/22/2015 11:09 AM  Lynn Espinoza  has presented today for surgery, with the diagnosis of prominent mass above clavicle on the right possable lipoma  The various methods of treatment have been discussed with the patient and family. After consideration of risks, benefits and other options for treatment, the patient has consented to  Procedure(s): EXCISION RIGHT SUPRACLAVICULAR  MASS (Right) as a surgical intervention .  The patient's history has been reviewed, patient examined, no change in status, stable for surgery.  I have reviewed the patient's chart and labs.  Questions were answered to the patient's satisfaction.     Vyla Pint B

## 2015-05-23 ENCOUNTER — Encounter (HOSPITAL_BASED_OUTPATIENT_CLINIC_OR_DEPARTMENT_OTHER): Payer: Self-pay | Admitting: Surgery

## 2015-07-06 DIAGNOSIS — S93412A Sprain of calcaneofibular ligament of left ankle, initial encounter: Secondary | ICD-10-CM | POA: Diagnosis not present

## 2015-07-06 DIAGNOSIS — M25571 Pain in right ankle and joints of right foot: Secondary | ICD-10-CM | POA: Diagnosis not present

## 2015-09-04 DIAGNOSIS — E782 Mixed hyperlipidemia: Secondary | ICD-10-CM | POA: Diagnosis not present

## 2015-09-04 DIAGNOSIS — I10 Essential (primary) hypertension: Secondary | ICD-10-CM | POA: Diagnosis not present

## 2015-09-04 DIAGNOSIS — Z13820 Encounter for screening for osteoporosis: Secondary | ICD-10-CM | POA: Diagnosis not present

## 2015-09-04 DIAGNOSIS — Z0000 Encounter for general adult medical examination without abnormal findings: Secondary | ICD-10-CM | POA: Diagnosis not present

## 2015-09-04 DIAGNOSIS — Z23 Encounter for immunization: Secondary | ICD-10-CM | POA: Diagnosis not present

## 2015-11-23 DIAGNOSIS — J019 Acute sinusitis, unspecified: Secondary | ICD-10-CM | POA: Diagnosis not present

## 2015-12-20 DIAGNOSIS — Z780 Asymptomatic menopausal state: Secondary | ICD-10-CM | POA: Diagnosis not present

## 2016-02-06 DIAGNOSIS — A09 Infectious gastroenteritis and colitis, unspecified: Secondary | ICD-10-CM | POA: Diagnosis not present

## 2016-02-12 ENCOUNTER — Other Ambulatory Visit: Payer: Self-pay

## 2016-02-12 DIAGNOSIS — Z1231 Encounter for screening mammogram for malignant neoplasm of breast: Secondary | ICD-10-CM

## 2016-02-14 ENCOUNTER — Ambulatory Visit
Admission: RE | Admit: 2016-02-14 | Discharge: 2016-02-14 | Disposition: A | Discharge status: Home / Self Care | Payer: Medicare Other | Source: Ambulatory Visit

## 2016-02-14 DIAGNOSIS — Z1231 Encounter for screening mammogram for malignant neoplasm of breast: Secondary | ICD-10-CM | POA: Diagnosis not present

## 2016-02-15 ENCOUNTER — Other Ambulatory Visit: Payer: Self-pay | Admitting: Family Medicine

## 2016-02-15 DIAGNOSIS — R928 Other abnormal and inconclusive findings on diagnostic imaging of breast: Secondary | ICD-10-CM

## 2016-02-19 ENCOUNTER — Ambulatory Visit
Admission: RE | Admit: 2016-02-19 | Discharge: 2016-02-19 | Disposition: A | Discharge status: Home / Self Care | Payer: Medicare Other | Source: Ambulatory Visit | Attending: Family Medicine | Admitting: Family Medicine

## 2016-02-19 DIAGNOSIS — R928 Other abnormal and inconclusive findings on diagnostic imaging of breast: Secondary | ICD-10-CM | POA: Diagnosis not present

## 2016-02-19 DIAGNOSIS — N6489 Other specified disorders of breast: Secondary | ICD-10-CM | POA: Diagnosis not present

## 2016-03-10 DIAGNOSIS — I10 Essential (primary) hypertension: Secondary | ICD-10-CM | POA: Diagnosis not present

## 2016-03-10 DIAGNOSIS — D1801 Hemangioma of skin and subcutaneous tissue: Secondary | ICD-10-CM | POA: Diagnosis not present

## 2016-03-10 DIAGNOSIS — Z1159 Encounter for screening for other viral diseases: Secondary | ICD-10-CM | POA: Diagnosis not present

## 2016-03-10 DIAGNOSIS — L720 Epidermal cyst: Secondary | ICD-10-CM | POA: Diagnosis not present

## 2016-03-10 DIAGNOSIS — L918 Other hypertrophic disorders of the skin: Secondary | ICD-10-CM | POA: Diagnosis not present

## 2016-03-10 DIAGNOSIS — L821 Other seborrheic keratosis: Secondary | ICD-10-CM | POA: Diagnosis not present

## 2016-03-10 DIAGNOSIS — E782 Mixed hyperlipidemia: Secondary | ICD-10-CM | POA: Diagnosis not present

## 2016-09-04 DIAGNOSIS — D234 Other benign neoplasm of skin of scalp and neck: Secondary | ICD-10-CM | POA: Diagnosis not present

## 2016-09-10 ENCOUNTER — Other Ambulatory Visit: Payer: Self-pay | Admitting: Family Medicine

## 2016-09-10 ENCOUNTER — Encounter (HOSPITAL_BASED_OUTPATIENT_CLINIC_OR_DEPARTMENT_OTHER): Payer: Self-pay | Admitting: *Deleted

## 2016-09-10 DIAGNOSIS — N6310 Unspecified lump in the right breast, unspecified quadrant: Secondary | ICD-10-CM

## 2016-09-11 DIAGNOSIS — E782 Mixed hyperlipidemia: Secondary | ICD-10-CM | POA: Diagnosis not present

## 2016-09-11 DIAGNOSIS — F439 Reaction to severe stress, unspecified: Secondary | ICD-10-CM | POA: Diagnosis not present

## 2016-09-11 DIAGNOSIS — I10 Essential (primary) hypertension: Secondary | ICD-10-CM | POA: Diagnosis not present

## 2016-09-11 DIAGNOSIS — Z23 Encounter for immunization: Secondary | ICD-10-CM | POA: Diagnosis not present

## 2016-09-15 ENCOUNTER — Other Ambulatory Visit: Payer: Self-pay

## 2016-09-15 ENCOUNTER — Encounter (HOSPITAL_BASED_OUTPATIENT_CLINIC_OR_DEPARTMENT_OTHER)
Admission: RE | Admit: 2016-09-15 | Discharge: 2016-09-15 | Disposition: A | Discharge status: Home / Self Care | Payer: Medicare Other | Source: Ambulatory Visit | Attending: Otolaryngology | Admitting: Otolaryngology

## 2016-09-15 DIAGNOSIS — Z01810 Encounter for preprocedural cardiovascular examination: Secondary | ICD-10-CM | POA: Insufficient documentation

## 2016-09-15 DIAGNOSIS — I10 Essential (primary) hypertension: Secondary | ICD-10-CM | POA: Insufficient documentation

## 2016-09-15 DIAGNOSIS — L720 Epidermal cyst: Secondary | ICD-10-CM | POA: Diagnosis not present

## 2016-09-15 DIAGNOSIS — E785 Hyperlipidemia, unspecified: Secondary | ICD-10-CM | POA: Diagnosis not present

## 2016-09-15 DIAGNOSIS — R221 Localized swelling, mass and lump, neck: Secondary | ICD-10-CM | POA: Diagnosis present

## 2016-09-15 DIAGNOSIS — Z79899 Other long term (current) drug therapy: Secondary | ICD-10-CM | POA: Diagnosis not present

## 2016-09-15 LAB — BASIC METABOLIC PANEL
Anion gap: 8 (ref 5–15)
BUN: 9 mg/dL (ref 6–20)
CALCIUM: 9.1 mg/dL (ref 8.9–10.3)
CO2: 29 mmol/L (ref 22–32)
CREATININE: 0.74 mg/dL (ref 0.44–1.00)
Chloride: 103 mmol/L (ref 101–111)
GFR calc non Af Amer: >60 mL/min (ref >60)
GLUCOSE: 121 mg/dL — AB (ref 65–99)
Potassium: 4.3 mmol/L (ref 3.5–5.1)
Sodium: 140 mmol/L (ref 135–145)

## 2016-09-15 NOTE — Progress Notes (Signed)
ekg reviewed by dr Delma Post no further treatment needed per sylvia davis rn

## 2016-09-16 ENCOUNTER — Ambulatory Visit: Payer: Self-pay | Admitting: Otolaryngology

## 2016-09-16 NOTE — H&P (Signed)
PREOPERATIVE H&P  Chief Complaint: left neck mass  HPI: Lynn Espinoza is a 65 y.o. female who presents for evaluation of left neck mass. She has had a nodule in the left neck for several years but it has gradually gotten a little bit larger where it is now more apparent. On exam it is in the mid left neck just anterior to the SCM muscle and measures approximately 1.5 cm in size and firm to palpation. She's taken to the OR for excision under local anesthesia.  Past Medical History:  Diagnosis Date  . Complication of anesthesia    BP went up, had to get IV meds  . Hyperlipidemia   . Hypertension   . Neck mass   . PONV (postoperative nausea and vomiting)   . Skin mass    Past Surgical History:  Procedure Laterality Date  . BREAST LUMPECTOMY Right 2007  . BUNIONECTOMY  2006  . CATARACT EXTRACTION    . MASS EXCISION Right 05/22/2015   Procedure: EXCISION RIGHT SUPRACLAVICULAR  MASS;  Surgeon: Johnathan Hausen, MD;  Location: [facility];  Service: General;  Laterality: Right;   Social History   Social History  . Marital status: Married    Spouse name: N/A  . Number of children: N/A  . Years of education: N/A   Social History Main Topics  . Smoking status: Never Smoker  . Smokeless tobacco: Never Used  . Alcohol use No  . Drug use: No  . Sexual activity: Not Asked   Other Topics Concern  . None   Social History Narrative  . None   History reviewed. No pertinent family history. Allergies  Allergen Reactions  . Fentanyl Nausea And Vomiting  . Latex    Prior to Admission medications   Medication Sig Start Date End Date Taking? Authorizing Provider  atorvastatin (LIPITOR) 20 MG tablet Take 20 mg by mouth daily.   Yes Historical Provider, MD  hydrochlorothiazide (MICROZIDE) 12.5 MG capsule Take 12.5 mg by mouth daily.   Yes Historical Provider, MD  lisinopril (PRINIVIL,ZESTRIL) 40 MG tablet Take 40 mg by mouth daily.   Yes Historical Provider, MD  metoprolol  succinate (TOPROL-XL) 25 MG 24 hr tablet Take 25 mg by mouth daily.   Yes Historical Provider, MD  Multiple Vitamins-Minerals (MULTIVITAMIN WITH MINERALS) tablet Take 1 tablet by mouth daily.   Yes Historical Provider, MD  potassium chloride (KLOR-CON) 8 MEQ tablet Take 8 mEq by mouth daily.   Yes Historical Provider, MD  Vitamin D, Ergocalciferol, (DRISDOL) 50000 UNITS CAPS capsule Take 50,000 Units by mouth every 7 (seven) days.   Yes Historical Provider, MD  HYDROcodone-acetaminophen (NORCO) 5-325 MG per tablet Take 1 tablet by mouth every 4 (four) hours as needed for moderate pain. 05/22/15   Johnathan Hausen, MD     Positive ROS: negative  All other systems have been reviewed and were otherwise negative with the exception of those mentioned in the HPI and as above.  Physical Exam: There were no vitals filed for this visit.  General: Alert, no acute distress Oral: Normal oral mucosa and tonsils Nasal: Clear nasal passages  FOL clear hypopharynx and larynx Neck: 1.5 cm subdermal nodule just anterior to the mid SCM muscle. No adenopathy noted or thyroid nodules Ear: Ear canal is clear with normal appearing TMs Cardiovascular: Regular rate and rhythm, no murmur.  Respiratory: Clear to auscultation Neurologic: Alert and oriented x 3   Assessment/Plan: BENIGH NEOPLASM OF SCALP AND SKIN Plan for Procedure(s):  EXCISION  NECK MASS AND CLOSURE   Melony Overly, MD 09/16/2016 2:37 PM

## 2016-09-17 ENCOUNTER — Ambulatory Visit
Admission: RE | Admit: 2016-09-17 | Discharge: 2016-09-17 | Disposition: A | Discharge status: Home / Self Care | Payer: Medicare Other | Source: Ambulatory Visit | Attending: Family Medicine | Admitting: Family Medicine

## 2016-09-17 DIAGNOSIS — N6489 Other specified disorders of breast: Secondary | ICD-10-CM | POA: Diagnosis not present

## 2016-09-17 DIAGNOSIS — R928 Other abnormal and inconclusive findings on diagnostic imaging of breast: Secondary | ICD-10-CM | POA: Diagnosis not present

## 2016-09-17 DIAGNOSIS — N6310 Unspecified lump in the right breast, unspecified quadrant: Secondary | ICD-10-CM

## 2016-09-19 ENCOUNTER — Encounter (HOSPITAL_BASED_OUTPATIENT_CLINIC_OR_DEPARTMENT_OTHER)
Admission: RE | Disposition: A | Discharge status: Home / Self Care | Payer: Self-pay | Source: Ambulatory Visit | Attending: Otolaryngology

## 2016-09-19 ENCOUNTER — Ambulatory Visit (HOSPITAL_BASED_OUTPATIENT_CLINIC_OR_DEPARTMENT_OTHER)
Admission: RE | Admit: 2016-09-19 | Discharge: 2016-09-19 | Disposition: A | Discharge status: Home / Self Care | Payer: Medicare Other | Source: Ambulatory Visit | Attending: Otolaryngology | Admitting: Otolaryngology

## 2016-09-19 ENCOUNTER — Encounter (HOSPITAL_BASED_OUTPATIENT_CLINIC_OR_DEPARTMENT_OTHER): Payer: Self-pay | Admitting: Anesthesiology

## 2016-09-19 DIAGNOSIS — E785 Hyperlipidemia, unspecified: Secondary | ICD-10-CM | POA: Insufficient documentation

## 2016-09-19 DIAGNOSIS — I10 Essential (primary) hypertension: Secondary | ICD-10-CM | POA: Diagnosis not present

## 2016-09-19 DIAGNOSIS — L720 Epidermal cyst: Secondary | ICD-10-CM | POA: Diagnosis not present

## 2016-09-19 DIAGNOSIS — Z79899 Other long term (current) drug therapy: Secondary | ICD-10-CM | POA: Insufficient documentation

## 2016-09-19 DIAGNOSIS — D234 Other benign neoplasm of skin of scalp and neck: Secondary | ICD-10-CM | POA: Diagnosis not present

## 2016-09-19 HISTORY — DX: Adverse effect of unspecified anesthetic, initial encounter: T41.45XA

## 2016-09-19 HISTORY — DX: Other specified postprocedural states: Z98.890

## 2016-09-19 HISTORY — DX: Nausea with vomiting, unspecified: R11.2

## 2016-09-19 HISTORY — DX: Localized swelling, mass and lump, neck: R22.1

## 2016-09-19 HISTORY — PX: MASS EXCISION: SHX2000

## 2016-09-19 HISTORY — DX: Other complications of anesthesia, initial encounter: T88.59XA

## 2016-09-19 SURGERY — MINOR EXCISION OF MASS
Anesthesia: LOCAL

## 2016-09-19 MED ORDER — LACTATED RINGERS IV SOLN
INTRAVENOUS | Status: DC
Start: 2016-09-19 — End: 2016-09-19

## 2016-09-19 MED ORDER — GLYCOPYRROLATE 0.2 MG/ML IJ SOLN: 0.2000 mg | Freq: Once | INTRAMUSCULAR | Status: DC | PRN

## 2016-09-19 MED ORDER — SCOPOLAMINE 1 MG/3DAYS TD PT72: 1.0000 | MEDICATED_PATCH | Freq: Once | TRANSDERMAL | Status: DC | PRN

## 2016-09-19 MED ORDER — FENTANYL CITRATE (PF) 100 MCG/2ML IJ SOLN: 50.0000 ug | INTRAMUSCULAR | Status: DC | PRN

## 2016-09-19 MED ORDER — LIDOCAINE-EPINEPHRINE 1 %-1:100000 IJ SOLN
INTRAMUSCULAR | Status: DC | PRN
  Administered 2016-09-19: 4 mL

## 2016-09-19 MED ORDER — MIDAZOLAM HCL 2 MG/2ML IJ SOLN: 1.0000 mg | INTRAMUSCULAR | Status: DC | PRN

## 2016-09-19 SURGICAL SUPPLY — 56 items
ADH SKN CLS APL DERMABOND .7 (GAUZE/BANDAGES/DRESSINGS) ×1
APL SKNCLS STERI-STRIP NONHPOA (GAUZE/BANDAGES/DRESSINGS)
BALL CTTN LRG ABS STRL LF (GAUZE/BANDAGES/DRESSINGS)
BANDAGE ADH SHEER 1  50/CT (GAUZE/BANDAGES/DRESSINGS) IMPLANT
BENZOIN TINCTURE PRP APPL 2/3 (GAUZE/BANDAGES/DRESSINGS) IMPLANT
BLADE SURG 15 STRL LF DISP TIS (BLADE) ×1 IMPLANT
BLADE SURG 15 STRL SS (BLADE) ×2
CANISTER SUCT 1200ML W/VALVE (MISCELLANEOUS) IMPLANT
CAUTERY EYE LOW TEMP 1300F FIN (OPHTHALMIC RELATED) IMPLANT
CLEANER CAUTERY TIP 5X5 PAD (MISCELLANEOUS) IMPLANT
COTTONBALL LRG STERILE PKG (GAUZE/BANDAGES/DRESSINGS) IMPLANT
DECANTER SPIKE VIAL GLASS SM (MISCELLANEOUS) IMPLANT
DERMABOND ADVANCED (GAUZE/BANDAGES/DRESSINGS) ×1
DERMABOND ADVANCED .7 DNX12 (GAUZE/BANDAGES/DRESSINGS) IMPLANT
DRSG TEGADERM 4X4.75 (GAUZE/BANDAGES/DRESSINGS) IMPLANT
ELECT COATED BLADE 2.86 ST (ELECTRODE) ×2 IMPLANT
ELECT REM PT RETURN 9FT ADLT (ELECTROSURGICAL) ×2
ELECTRODE REM PT RTRN 9FT ADLT (ELECTROSURGICAL) ×1 IMPLANT
GAUZE SPONGE 4X4 16PLY XRAY LF (GAUZE/BANDAGES/DRESSINGS) IMPLANT
GLOVE SS BIOGEL STRL SZ 7.5 (GLOVE) ×1 IMPLANT
GLOVE SUPERSENSE BIOGEL SZ 7.5 (GLOVE) ×1
GOWN STRL REUS W/ TWL LRG LVL3 (GOWN DISPOSABLE) IMPLANT
GOWN STRL REUS W/TWL LRG LVL3 (GOWN DISPOSABLE)
MARKER SKIN DUAL TIP RULER LAB (MISCELLANEOUS) IMPLANT
NDL PRECISIONGLIDE 27X1.5 (NEEDLE) ×1 IMPLANT
NEEDLE PRECISIONGLIDE 27X1.5 (NEEDLE) ×2 IMPLANT
NS IRRIG 1000ML POUR BTL (IV SOLUTION) ×1 IMPLANT
PACK BASIN DAY SURGERY FS (CUSTOM PROCEDURE TRAY) ×1 IMPLANT
PAD CLEANER CAUTERY TIP 5X5 (MISCELLANEOUS)
PENCIL BUTTON HOLSTER BLD 10FT (ELECTRODE) ×2 IMPLANT
SPONGE GAUZE 4X4 12PLY STER LF (GAUZE/BANDAGES/DRESSINGS) IMPLANT
SPONGE INTESTINAL PEANUT (DISPOSABLE) IMPLANT
STRIP CLOSURE SKIN 1/2X4 (GAUZE/BANDAGES/DRESSINGS) IMPLANT
STRIP CLOSURE SKIN 1/4X4 (GAUZE/BANDAGES/DRESSINGS) IMPLANT
SUCTION FRAZIER HANDLE 10FR (MISCELLANEOUS)
SUCTION TUBE FRAZIER 10FR DISP (MISCELLANEOUS) IMPLANT
SUT CHROMIC 3 0 PS 2 (SUTURE) ×1 IMPLANT
SUT CHROMIC 4 0 P 3 18 (SUTURE) IMPLANT
SUT ETHILON 5 0 P 3 18 (SUTURE)
SUT ETHILON 6 0 P 1 (SUTURE) IMPLANT
SUT NYLON ETHILON 5-0 P-3 1X18 (SUTURE) IMPLANT
SUT PLAIN 5 0 P 3 18 (SUTURE) IMPLANT
SUT SILK 4 0 TIES 17X18 (SUTURE) IMPLANT
SUT VIC AB 4-0 P-3 18XBRD (SUTURE) IMPLANT
SUT VIC AB 4-0 P3 18 (SUTURE)
SUT VIC AB 5-0 P-3 18X BRD (SUTURE) IMPLANT
SUT VIC AB 5-0 P3 18 (SUTURE) ×2
SWAB COLLECTION DEVICE MRSA (MISCELLANEOUS) IMPLANT
SWAB CULTURE ESWAB REG 1ML (MISCELLANEOUS) IMPLANT
SWABSTICK POVIDONE IODINE SNGL (MISCELLANEOUS) ×2 IMPLANT
SYR BULB 3OZ (MISCELLANEOUS) ×1 IMPLANT
SYR CONTROL 10ML LL (SYRINGE) ×2 IMPLANT
TOWEL OR 17X24 6PK STRL BLUE (TOWEL DISPOSABLE) ×2 IMPLANT
TRAY DSU PREP LF (CUSTOM PROCEDURE TRAY) ×1 IMPLANT
TUBE CONNECTING 20X1/4 (TUBING) IMPLANT
YANKAUER SUCT BULB TIP NO VENT (SUCTIONS) IMPLANT

## 2016-09-19 NOTE — Discharge Instructions (Signed)
Tylenol or motrin prn pain Keep incision area dry for 12 hrs Call office for follow up appt for next Thurs.

## 2016-09-19 NOTE — Brief Op Note (Signed)
09/19/2016  8:56 AM  PATIENT:  Lynn Espinoza  65 y.o. female  PRE-OPERATIVE DIAGNOSIS:  BENIGH NEOPLASM OF SCALP AND SKIN                                                       Left Neck Mass POST-OPERATIVE DIAGNOSIS:  BENIGN NEOPLASM OF SCALP AND SKIN                                                        Left Neck Mass PROCEDURE:  Procedure(s): EXCISION  NECK MASS AND CLOSURE (N/A)  SURGEON:  Surgeon(s) and Role:    * Rozetta Nunnery, MD - Primary  PHYSICIAN ASSISTANT:   ASSISTANTS: none   ANESTHESIA:   local  EBL:  No intake/output data recorded.  BLOOD ADMINISTERED:none  DRAINS: none   LOCAL MEDICATIONS USED:  XYLOCAINE with EPI  4 cc  SPECIMEN:  Source of Specimen:  left neck mass  DISPOSITION OF SPECIMEN:  PATHOLOGY  COUNTS:  YES  TOURNIQUET:  * No tourniquets in log *  DICTATION: .Other Dictation: Dictation Number [id number]  PLAN OF CARE: Discharge to home after PACU  PATIENT DISPOSITION:  PACU - hemodynamically stable.   Delay start of Pharmacological VTE agent (>24hrs) due to surgical blood loss or risk of bleeding: not applicable

## 2016-09-22 ENCOUNTER — Encounter (HOSPITAL_BASED_OUTPATIENT_CLINIC_OR_DEPARTMENT_OTHER): Payer: Self-pay | Admitting: Otolaryngology

## 2016-09-22 NOTE — Op Note (Signed)
[name], [name]               ACCOUNT NO.:  [id number]  MEDICAL RECORD NO.:  [id number]  LOCATION:                                 FACILITY:  PHYSICIAN:  Leonides Sake. Lucia Gaskins, M.D.DATE OF BIRTH:  07/04/1951  DATE OF PROCEDURE:  09/19/2016 DATE OF DISCHARGE:                              OPERATIVE REPORT   PREOPERATIVE DIAGNOSIS:  Chronic left neck mass.  POSTOPERATIVE DIAGNOSIS:  Chronic left neck mass.  OPERATION:  Excision of left neck mass. 2 cm  SURGEON:  Mayank Teuscher E. Lucia Gaskins, MD  ANESTHESIA:  Local, 1% Xylocaine with 100,000 epinephrine, 4 mL.  COMPLICATIONS:  None.  BRIEF CLINICAL NOTE:  The patient is a 65 year old female, who has had a left neck nodule now for several years.  It has gradually gotten a little bit larger.  On exam, she has approximate 1.5 cm subdermal left neck mass overriding the sternocleidomastoid muscle in mid left neck. Over the last year, it has become a little bit more obvious.  She is taken to the operative room at this time for excision under local anesthesia.  DESCRIPTION OF PROCEDURE:  The patient was brought to the operating room.  The left neck was prepped with Betadine solution and draped with sterile towels.  This area was injected with 4 mL of Xylocaine with epinephrine.  The nodule was attached to the dermis.  An elliptical excision of the nodule on the dermis overlying the nodule was performed. Dissection was carried down through the subcutaneous tissue sharply with scissors and cautery was used for hemostasis.  There was no deep fistula tract noted.  It ended at the level of the platysma muscle.  Hemostasis was obtained with cautery.  Specimen was sent to pathology and the defect was closed with 3-0 chromic sutures subcutaneously and then a 5-0 Vicryl subcuticular stitch followed by Dermabond.  Chrishonda tolerated this well.  She was discharged home later this morning.  Instructed to keep the area dry for 24 hours and follow up  in my office in 6 days for recheck and review of final pathology.   ______________________________ Leonides Sake Lucia Gaskins, M.D.   ______________________________ Leonides Sake. Lucia Gaskins, M.D.   CEN/MEDQ  D:  09/19/2016  T:  09/20/2016  Job:  [id number]

## 2016-11-21 DIAGNOSIS — J019 Acute sinusitis, unspecified: Secondary | ICD-10-CM | POA: Diagnosis not present

## 2017-03-16 DIAGNOSIS — I10 Essential (primary) hypertension: Secondary | ICD-10-CM | POA: Diagnosis not present

## 2017-03-16 DIAGNOSIS — Z1389 Encounter for screening for other disorder: Secondary | ICD-10-CM | POA: Diagnosis not present

## 2017-03-16 DIAGNOSIS — E782 Mixed hyperlipidemia: Secondary | ICD-10-CM | POA: Diagnosis not present

## 2017-03-16 DIAGNOSIS — Z1159 Encounter for screening for other viral diseases: Secondary | ICD-10-CM | POA: Diagnosis not present

## 2017-03-16 DIAGNOSIS — Z0000 Encounter for general adult medical examination without abnormal findings: Secondary | ICD-10-CM | POA: Diagnosis not present

## 2017-05-25 ENCOUNTER — Other Ambulatory Visit: Payer: Self-pay | Admitting: Family Medicine

## 2017-05-25 DIAGNOSIS — N6489 Other specified disorders of breast: Secondary | ICD-10-CM

## 2017-06-02 DIAGNOSIS — J069 Acute upper respiratory infection, unspecified: Secondary | ICD-10-CM | POA: Diagnosis not present

## 2017-06-04 ENCOUNTER — Ambulatory Visit
Admission: RE | Admit: 2017-06-04 | Discharge: 2017-06-04 | Disposition: A | Discharge status: Home / Self Care | Payer: Medicare Other | Source: Ambulatory Visit | Attending: Family Medicine | Admitting: Family Medicine

## 2017-06-04 ENCOUNTER — Ambulatory Visit: Payer: Medicare Other

## 2017-06-04 DIAGNOSIS — R922 Inconclusive mammogram: Secondary | ICD-10-CM | POA: Diagnosis not present

## 2017-06-04 DIAGNOSIS — N6489 Other specified disorders of breast: Secondary | ICD-10-CM

## 2017-06-04 DIAGNOSIS — R928 Other abnormal and inconclusive findings on diagnostic imaging of breast: Secondary | ICD-10-CM | POA: Diagnosis not present

## 2017-09-15 DIAGNOSIS — I10 Essential (primary) hypertension: Secondary | ICD-10-CM | POA: Diagnosis not present

## 2017-09-15 DIAGNOSIS — Z23 Encounter for immunization: Secondary | ICD-10-CM | POA: Diagnosis not present

## 2017-09-15 DIAGNOSIS — E782 Mixed hyperlipidemia: Secondary | ICD-10-CM | POA: Diagnosis not present

## 2018-03-08 DIAGNOSIS — J0100 Acute maxillary sinusitis, unspecified: Secondary | ICD-10-CM | POA: Diagnosis not present

## 2018-03-08 DIAGNOSIS — R05 Cough: Secondary | ICD-10-CM | POA: Diagnosis not present

## 2018-03-22 DIAGNOSIS — Z1159 Encounter for screening for other viral diseases: Secondary | ICD-10-CM | POA: Diagnosis not present

## 2018-03-22 DIAGNOSIS — Z1389 Encounter for screening for other disorder: Secondary | ICD-10-CM | POA: Diagnosis not present

## 2018-03-22 DIAGNOSIS — E782 Mixed hyperlipidemia: Secondary | ICD-10-CM | POA: Diagnosis not present

## 2018-03-22 DIAGNOSIS — Z0000 Encounter for general adult medical examination without abnormal findings: Secondary | ICD-10-CM | POA: Diagnosis not present

## 2018-03-22 DIAGNOSIS — I10 Essential (primary) hypertension: Secondary | ICD-10-CM | POA: Diagnosis not present

## 2018-04-08 DIAGNOSIS — Z961 Presence of intraocular lens: Secondary | ICD-10-CM | POA: Diagnosis not present

## 2018-06-25 ENCOUNTER — Other Ambulatory Visit: Payer: Self-pay | Admitting: Family Medicine

## 2018-06-25 DIAGNOSIS — Z1231 Encounter for screening mammogram for malignant neoplasm of breast: Secondary | ICD-10-CM

## 2018-07-23 ENCOUNTER — Ambulatory Visit
Admission: RE | Admit: 2018-07-23 | Discharge: 2018-07-23 | Disposition: A | Discharge status: Home / Self Care | Payer: Medicare Other | Source: Ambulatory Visit | Attending: Family Medicine | Admitting: Family Medicine

## 2018-07-23 DIAGNOSIS — Z1231 Encounter for screening mammogram for malignant neoplasm of breast: Secondary | ICD-10-CM | POA: Diagnosis not present

## 2018-08-29 DIAGNOSIS — Z23 Encounter for immunization: Secondary | ICD-10-CM | POA: Diagnosis not present

## 2018-09-21 DIAGNOSIS — F439 Reaction to severe stress, unspecified: Secondary | ICD-10-CM | POA: Diagnosis not present

## 2018-09-21 DIAGNOSIS — E782 Mixed hyperlipidemia: Secondary | ICD-10-CM | POA: Diagnosis not present

## 2018-09-21 DIAGNOSIS — I10 Essential (primary) hypertension: Secondary | ICD-10-CM | POA: Diagnosis not present

## 2018-11-01 DIAGNOSIS — I10 Essential (primary) hypertension: Secondary | ICD-10-CM | POA: Diagnosis not present

## 2018-11-01 DIAGNOSIS — F439 Reaction to severe stress, unspecified: Secondary | ICD-10-CM | POA: Diagnosis not present

## 2019-01-31 DIAGNOSIS — E782 Mixed hyperlipidemia: Secondary | ICD-10-CM | POA: Diagnosis not present

## 2019-01-31 DIAGNOSIS — F439 Reaction to severe stress, unspecified: Secondary | ICD-10-CM | POA: Diagnosis not present

## 2019-01-31 DIAGNOSIS — I10 Essential (primary) hypertension: Secondary | ICD-10-CM | POA: Diagnosis not present

## 2019-01-31 DIAGNOSIS — Z1211 Encounter for screening for malignant neoplasm of colon: Secondary | ICD-10-CM | POA: Diagnosis not present

## 2019-04-04 DIAGNOSIS — Z0000 Encounter for general adult medical examination without abnormal findings: Secondary | ICD-10-CM | POA: Diagnosis not present

## 2019-04-04 DIAGNOSIS — E782 Mixed hyperlipidemia: Secondary | ICD-10-CM | POA: Diagnosis not present

## 2019-04-04 DIAGNOSIS — M1990 Unspecified osteoarthritis, unspecified site: Secondary | ICD-10-CM | POA: Diagnosis not present

## 2019-04-04 DIAGNOSIS — Z1389 Encounter for screening for other disorder: Secondary | ICD-10-CM | POA: Diagnosis not present

## 2019-04-04 DIAGNOSIS — I10 Essential (primary) hypertension: Secondary | ICD-10-CM | POA: Diagnosis not present

## 2019-04-04 DIAGNOSIS — F439 Reaction to severe stress, unspecified: Secondary | ICD-10-CM | POA: Diagnosis not present

## 2019-04-22 DIAGNOSIS — Z961 Presence of intraocular lens: Secondary | ICD-10-CM | POA: Diagnosis not present

## 2019-06-16 ENCOUNTER — Other Ambulatory Visit: Payer: Self-pay | Admitting: Family Medicine

## 2019-06-16 DIAGNOSIS — Z20822 Contact with and (suspected) exposure to covid-19: Secondary | ICD-10-CM

## 2019-06-16 DIAGNOSIS — Z20828 Contact with and (suspected) exposure to other viral communicable diseases: Secondary | ICD-10-CM | POA: Diagnosis not present

## 2019-06-18 LAB — NOVEL CORONAVIRUS, NAA: SARS-CoV-2, NAA: NOT DETECTED

## 2019-07-22 DIAGNOSIS — Z23 Encounter for immunization: Secondary | ICD-10-CM | POA: Diagnosis not present

## 2019-09-20 DIAGNOSIS — H9311 Tinnitus, right ear: Secondary | ICD-10-CM | POA: Diagnosis not present

## 2019-09-26 ENCOUNTER — Other Ambulatory Visit: Payer: Self-pay | Admitting: Family Medicine

## 2019-09-26 DIAGNOSIS — Z1231 Encounter for screening mammogram for malignant neoplasm of breast: Secondary | ICD-10-CM

## 2019-09-28 ENCOUNTER — Ambulatory Visit
Admission: RE | Admit: 2019-09-28 | Discharge: 2019-09-28 | Disposition: A | Discharge status: Home / Self Care | Payer: Medicare Other | Source: Ambulatory Visit | Attending: Family Medicine | Admitting: Family Medicine

## 2019-09-28 ENCOUNTER — Other Ambulatory Visit: Payer: Self-pay

## 2019-09-28 DIAGNOSIS — Z1231 Encounter for screening mammogram for malignant neoplasm of breast: Secondary | ICD-10-CM

## 2019-10-07 ENCOUNTER — Encounter (INDEPENDENT_AMBULATORY_CARE_PROVIDER_SITE_OTHER): Payer: Self-pay | Admitting: Otolaryngology

## 2019-10-07 ENCOUNTER — Ambulatory Visit (INDEPENDENT_AMBULATORY_CARE_PROVIDER_SITE_OTHER): Payer: Medicare Other | Admitting: Otolaryngology

## 2019-10-07 ENCOUNTER — Other Ambulatory Visit: Payer: Self-pay

## 2019-10-07 VITALS — Temp 97.0°F

## 2019-10-07 DIAGNOSIS — Z20828 Contact with and (suspected) exposure to other viral communicable diseases: Secondary | ICD-10-CM | POA: Diagnosis not present

## 2019-10-07 DIAGNOSIS — H903 Sensorineural hearing loss, bilateral: Secondary | ICD-10-CM | POA: Diagnosis not present

## 2019-10-07 DIAGNOSIS — H9312 Tinnitus, left ear: Secondary | ICD-10-CM | POA: Diagnosis not present

## 2019-10-07 DIAGNOSIS — H6121 Impacted cerumen, right ear: Secondary | ICD-10-CM | POA: Diagnosis not present

## 2019-10-07 DIAGNOSIS — H9311 Tinnitus, right ear: Secondary | ICD-10-CM | POA: Diagnosis not present

## 2019-10-07 DIAGNOSIS — Z20822 Contact with and (suspected) exposure to covid-19: Secondary | ICD-10-CM

## 2019-10-07 NOTE — Progress Notes (Signed)
HPI: Lynn Espinoza is a 68 y.o. female who is referred by Dr. Tamala Julian for evaluation of ringing in her right ear. Patient states that two months ago she started noticing ringing in her right ear that was occasionally a ringing and occasionally a whooshing sound that was in tune with her heartbeat. It is now constant. She denies any changes in her hearing. She denies any ear pain or ear drainage. She states that the ringing started during a very stressful time in her work and personal life. She denies history of trauma, frequent ear infections or loud noise exposure.  Past Medical History:  Diagnosis Date  . Complication of anesthesia    BP went up, had to get IV meds  . Hyperlipidemia   . Hypertension   . Neck mass   . PONV (postoperative nausea and vomiting)   . Skin mass    Past Surgical History:  Procedure Laterality Date  . BREAST EXCISIONAL BIOPSY Right   . BREAST LUMPECTOMY Right 2007  . BUNIONECTOMY  2006  . CATARACT EXTRACTION    . MASS EXCISION Right 05/22/2015   Procedure: EXCISION RIGHT SUPRACLAVICULAR  MASS;  Surgeon: Johnathan Hausen, MD;  Location: [facility];  Service: General;  Laterality: Right;  . MASS EXCISION N/A 09/19/2016   Procedure: EXCISION  NECK MASS AND CLOSURE;  Surgeon: Rozetta Nunnery, MD;  Location: [facility];  Service: ENT;  Laterality: N/A;   Social History   Socioeconomic History  . Marital status: Married    Spouse name: Not on file  . Number of children: Not on file  . Years of education: Not on file  . Highest education level: Not on file  Occupational History  . Not on file  Social Needs  . Financial resource strain: Not on file  . Food insecurity    Worry: Not on file    Inability: Not on file  . Transportation needs    Medical: Not on file    Non-medical: Not on file  Tobacco Use  . Smoking status: Never Smoker  . Smokeless tobacco: Never Used  Substance and Sexual Activity  . Alcohol use: No  .  Drug use: No  . Sexual activity: Not on file  Lifestyle  . Physical activity    Days per week: Not on file    Minutes per session: Not on file  . Stress: Not on file  Relationships  . Social Herbalist on phone: Not on file    Gets together: Not on file    Attends religious service: Not on file    Active member of club or organization: Not on file    Attends meetings of clubs or organizations: Not on file    Relationship status: Not on file  Other Topics Concern  . Not on file  Social History Narrative  . Not on file   No family history on file. Allergies  Allergen Reactions  . Fentanyl Nausea And Vomiting  . Latex    Prior to Admission medications   Medication Sig Start Date End Date Taking? Authorizing Provider  atorvastatin (LIPITOR) 20 MG tablet Take 20 mg by mouth daily.    [provider]  hydrochlorothiazide (MICROZIDE) 12.5 MG capsule Take 12.5 mg by mouth daily.    [provider]  HYDROcodone-acetaminophen (NORCO) 5-325 MG per tablet Take 1 tablet by mouth every 4 (four) hours as needed for moderate pain. 05/22/15   Johnathan Hausen, MD  lisinopril (PRINIVIL,ZESTRIL) 40 MG tablet Take 40 mg by mouth daily.    [provider]  metoprolol succinate (TOPROL-XL) 25 MG 24 hr tablet Take 25 mg by mouth daily.    [provider]  Multiple Vitamins-Minerals (MULTIVITAMIN WITH MINERALS) tablet Take 1 tablet by mouth daily.    [provider]  potassium chloride (KLOR-CON) 8 MEQ tablet Take 8 mEq by mouth daily.    [provider]  Vitamin D, Ergocalciferol, (DRISDOL) 50000 UNITS CAPS capsule Take 50,000 Units by mouth every 7 (seven) days.    [provider]     Positive ROS: negative for ear pain, ear drainage.  All other systems have been reviewed and were otherwise negative with the exception of those mentioned in the HPI and as above.  Physical Exam: General: Alert, no acute distress Ears:  External ears without lesions or tenderness. Ear canals are moderately obstructed with wax bilaterally which was cleaned revealing clear EACs, and clear, intact TMs. Tuning forks reveal AC>BC bilaterally. Audiogram shows normal slowing to a mild SNHL bilaterally. Type A tympanogram on right with Type As in left. SRTs are 25 dB in the right and 30 dB in the left. No audible tinnitus with stethoscope. Nasal: External nose without deformity or lesions. Minor septal deviation to the right. Clear nasal passages Oral: Clear oropharynx Neck: No palpable adenopathy or masses  Cerumen impaction removal  Date/Time: 10/07/2019 2:04 PM Performed by: Lucilla Edin, PA-C Authorized by: Rozetta Nunnery, MD   Consent:    Consent obtained:  Verbal   Consent given by:  Patient   Risks discussed:  Pain and bleeding Procedure details:    Location:  R ear   Procedure type: curette and forceps   Post-procedure details:    Inspection:  TM intact and canal normal   Hearing quality:  Improved   Patient tolerance of procedure:  Tolerated well, no immediate complications    Assessment: Tinnitus, right ear SNHL, bilateral Cerumen impaction  Plan: Re-assured patient regarding exam today. Reviewed audiogram with patient showing normal sloping to mild SNHL bilaterally. Recommend trial of Lipoflavonoid; samples provided in office. She will return as needed.   Christin Hoffstadt, PA-C   I agree with the assessment and plan as outlined above. Radene Journey, MD

## 2019-10-11 LAB — NOVEL CORONAVIRUS, NAA: SARS-CoV-2, NAA: NOT DETECTED

## 2020-03-27 DIAGNOSIS — I10 Essential (primary) hypertension: Secondary | ICD-10-CM | POA: Diagnosis not present

## 2020-03-27 DIAGNOSIS — E782 Mixed hyperlipidemia: Secondary | ICD-10-CM | POA: Diagnosis not present

## 2020-03-27 DIAGNOSIS — F439 Reaction to severe stress, unspecified: Secondary | ICD-10-CM | POA: Diagnosis not present

## 2020-08-15 ENCOUNTER — Other Ambulatory Visit: Payer: Self-pay | Admitting: Family Medicine

## 2020-08-15 DIAGNOSIS — Z1231 Encounter for screening mammogram for malignant neoplasm of breast: Secondary | ICD-10-CM

## 2020-08-21 DIAGNOSIS — Z23 Encounter for immunization: Secondary | ICD-10-CM | POA: Diagnosis not present

## 2020-09-23 IMAGING — MG DIGITAL SCREENING BILAT W/ TOMO W/ CAD
6 of 10 series · 6 of 30 positions shown · non-contrast
Comparison: Previous exam(s).

CLINICAL DATA: Screening.

EXAM:
DIGITAL SCREENING BILATERAL MAMMOGRAM WITH TOMO AND CAD

[L MLO synth-2D (1 of 2)]
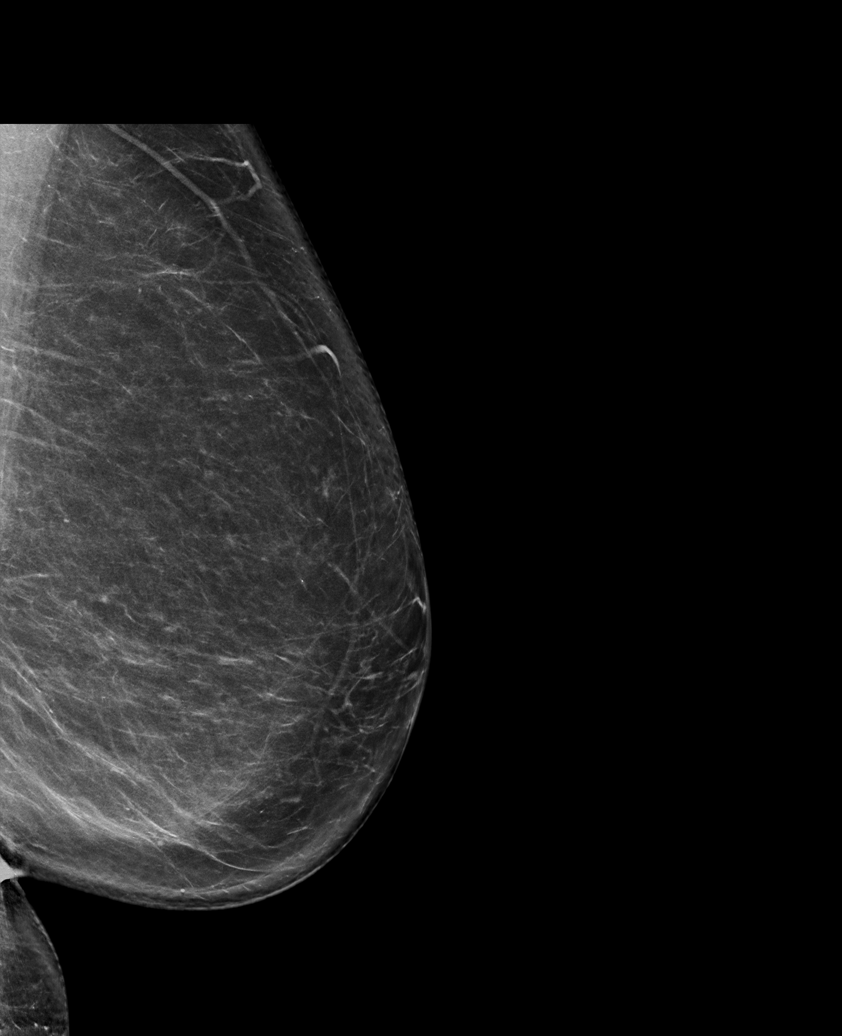

[L CC synth-2D]
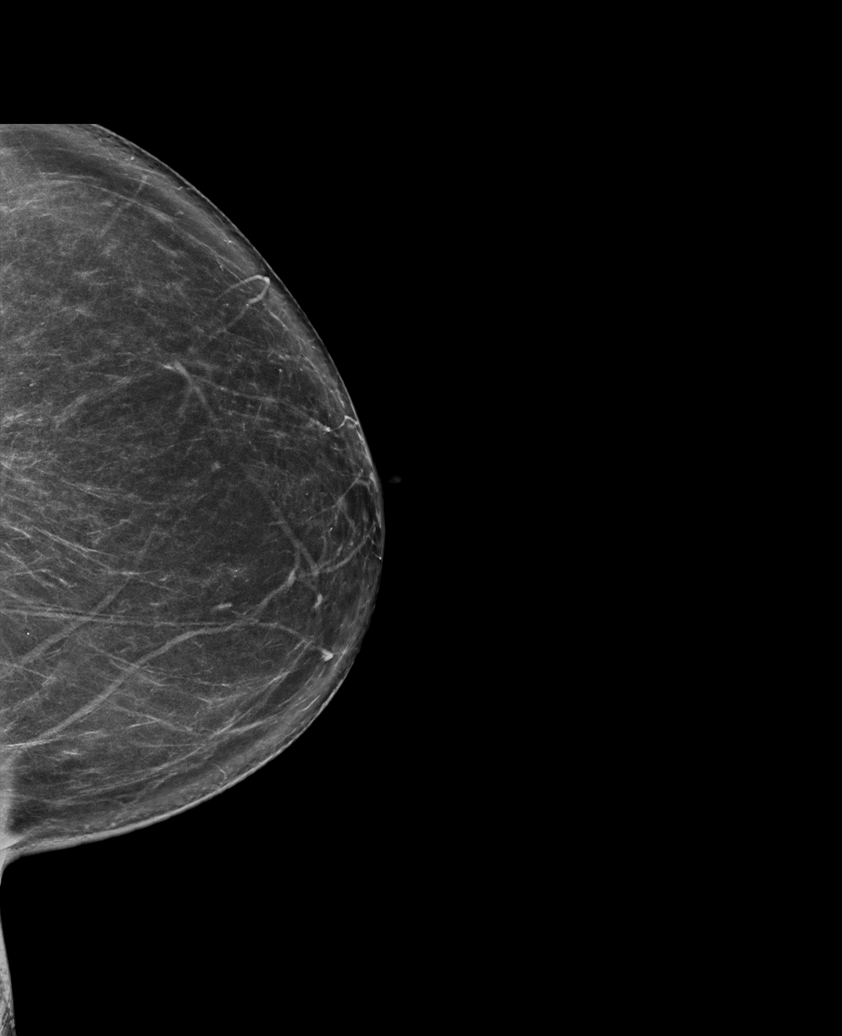

[R CC synth-2D]
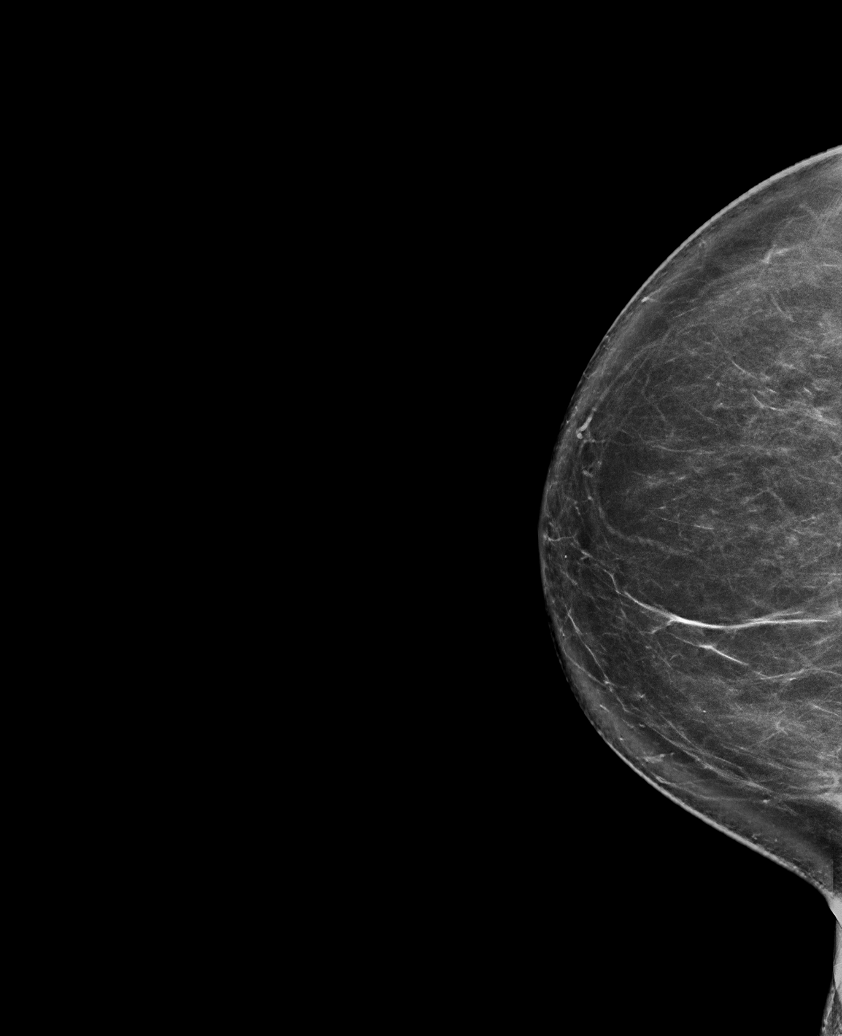

[L MLO synth-2D (2 of 2)]
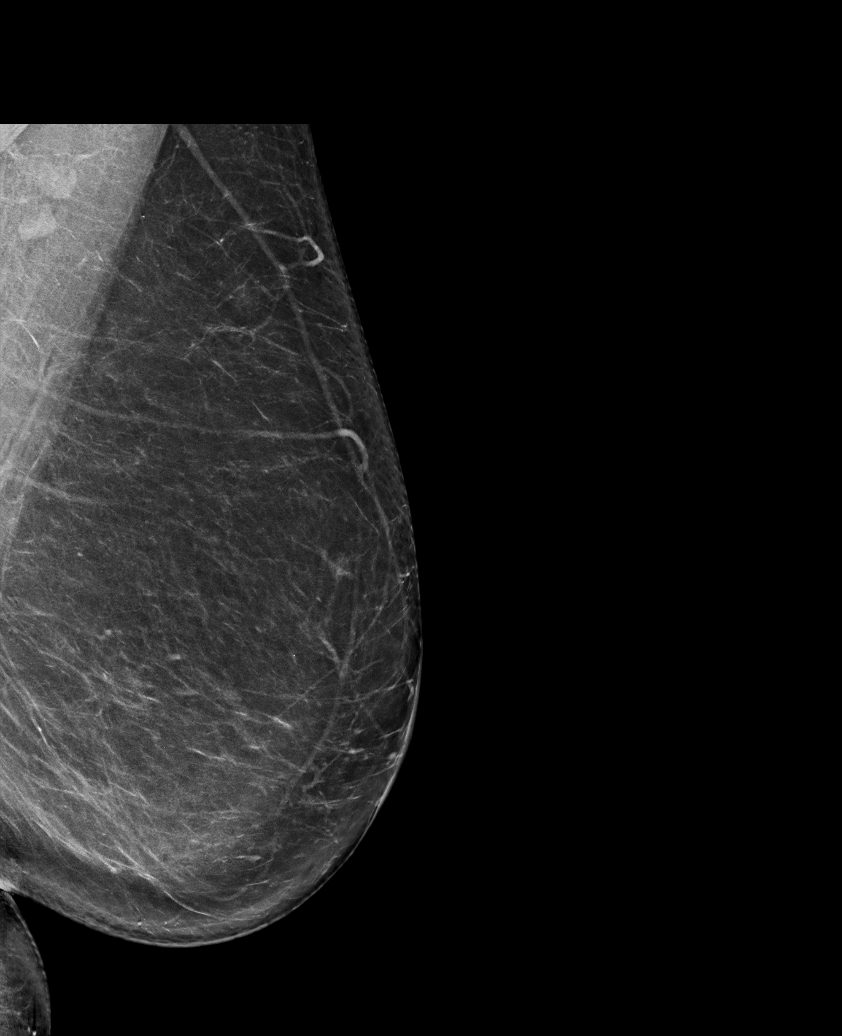

[R MLO synth-2D]
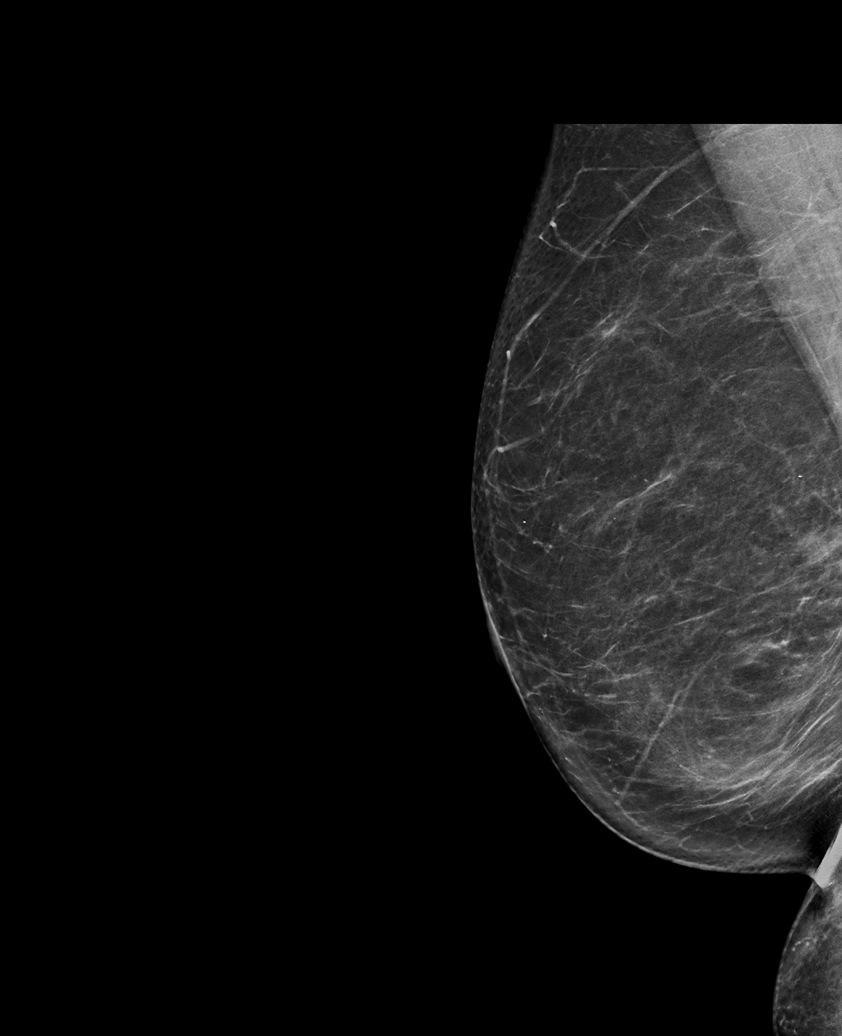

[L MLO tomo · tomo slice 43/84.0]
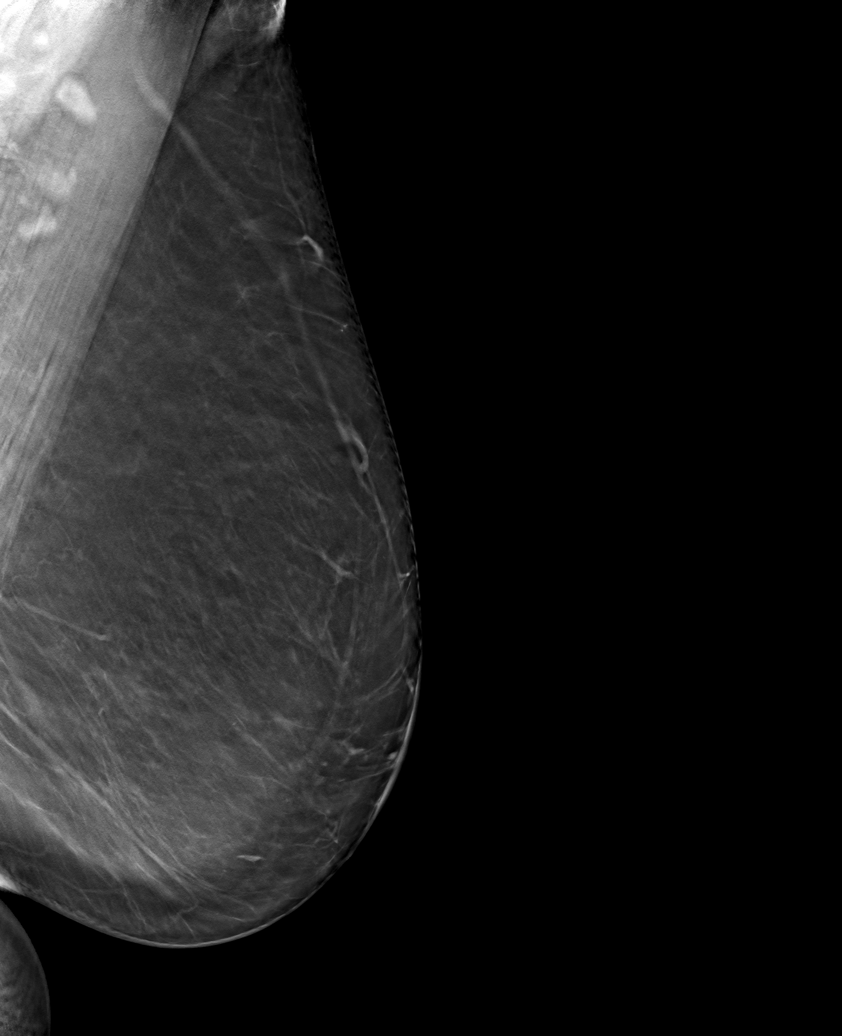

[6 of 30 positions shown; findings below may reference images not displayed]

ACR Breast Density Category b: There are scattered areas of
fibroglandular density.
FINDINGS: There are no findings suspicious for malignancy. Images were
processed with CAD.
IMPRESSION: No mammographic evidence of malignancy. A result letter of this
screening mammogram will be mailed directly to the patient.

RECOMMENDATION:
Screening mammogram in one year. (Code:CN-U-775)

BI-RADS CATEGORY  1: Negative.

## 2020-09-28 ENCOUNTER — Ambulatory Visit: Payer: Medicare Other

## 2020-09-28 ENCOUNTER — Ambulatory Visit
Admission: RE | Admit: 2020-09-28 | Discharge: 2020-09-28 | Disposition: A | Discharge status: Home / Self Care | Payer: Medicare Other | Source: Ambulatory Visit | Attending: Family Medicine | Admitting: Family Medicine

## 2020-09-28 ENCOUNTER — Other Ambulatory Visit: Payer: Self-pay

## 2020-09-28 DIAGNOSIS — Z1231 Encounter for screening mammogram for malignant neoplasm of breast: Secondary | ICD-10-CM | POA: Diagnosis not present

## 2020-10-04 DIAGNOSIS — Z1389 Encounter for screening for other disorder: Secondary | ICD-10-CM | POA: Diagnosis not present

## 2020-10-04 DIAGNOSIS — E782 Mixed hyperlipidemia: Secondary | ICD-10-CM | POA: Diagnosis not present

## 2020-10-04 DIAGNOSIS — F439 Reaction to severe stress, unspecified: Secondary | ICD-10-CM | POA: Diagnosis not present

## 2020-10-04 DIAGNOSIS — I10 Essential (primary) hypertension: Secondary | ICD-10-CM | POA: Diagnosis not present

## 2020-10-04 DIAGNOSIS — Z0000 Encounter for general adult medical examination without abnormal findings: Secondary | ICD-10-CM | POA: Diagnosis not present

## 2020-11-15 DIAGNOSIS — E876 Hypokalemia: Secondary | ICD-10-CM | POA: Diagnosis not present

## 2020-11-21 DIAGNOSIS — M1990 Unspecified osteoarthritis, unspecified site: Secondary | ICD-10-CM | POA: Diagnosis not present

## 2020-11-21 DIAGNOSIS — I10 Essential (primary) hypertension: Secondary | ICD-10-CM | POA: Diagnosis not present

## 2020-11-21 DIAGNOSIS — E785 Hyperlipidemia, unspecified: Secondary | ICD-10-CM | POA: Diagnosis not present

## 2020-11-21 DIAGNOSIS — E782 Mixed hyperlipidemia: Secondary | ICD-10-CM | POA: Diagnosis not present

## 2021-02-07 DIAGNOSIS — Z1211 Encounter for screening for malignant neoplasm of colon: Secondary | ICD-10-CM | POA: Diagnosis not present

## 2021-02-07 DIAGNOSIS — Z800 Family history of malignant neoplasm of digestive organs: Secondary | ICD-10-CM | POA: Diagnosis not present

## 2021-02-07 DIAGNOSIS — K5900 Constipation, unspecified: Secondary | ICD-10-CM | POA: Diagnosis not present

## 2021-02-07 DIAGNOSIS — Z86010 Personal history of colonic polyps: Secondary | ICD-10-CM | POA: Diagnosis not present

## 2021-02-21 DIAGNOSIS — I10 Essential (primary) hypertension: Secondary | ICD-10-CM | POA: Diagnosis not present

## 2021-02-21 DIAGNOSIS — M1990 Unspecified osteoarthritis, unspecified site: Secondary | ICD-10-CM | POA: Diagnosis not present

## 2021-02-21 DIAGNOSIS — E785 Hyperlipidemia, unspecified: Secondary | ICD-10-CM | POA: Diagnosis not present

## 2021-02-21 DIAGNOSIS — E782 Mixed hyperlipidemia: Secondary | ICD-10-CM | POA: Diagnosis not present

## 2021-03-04 DIAGNOSIS — Z1211 Encounter for screening for malignant neoplasm of colon: Secondary | ICD-10-CM | POA: Diagnosis not present

## 2021-03-04 DIAGNOSIS — D125 Benign neoplasm of sigmoid colon: Secondary | ICD-10-CM | POA: Diagnosis not present

## 2021-03-04 DIAGNOSIS — K635 Polyp of colon: Secondary | ICD-10-CM | POA: Diagnosis not present

## 2021-03-04 DIAGNOSIS — Z86010 Personal history of colonic polyps: Secondary | ICD-10-CM | POA: Diagnosis not present

## 2021-03-04 DIAGNOSIS — Z800 Family history of malignant neoplasm of digestive organs: Secondary | ICD-10-CM | POA: Diagnosis not present

## 2021-04-03 DIAGNOSIS — I10 Essential (primary) hypertension: Secondary | ICD-10-CM | POA: Diagnosis not present

## 2021-04-03 DIAGNOSIS — E782 Mixed hyperlipidemia: Secondary | ICD-10-CM | POA: Diagnosis not present

## 2021-04-03 DIAGNOSIS — F439 Reaction to severe stress, unspecified: Secondary | ICD-10-CM | POA: Diagnosis not present

## 2021-05-10 DIAGNOSIS — I10 Essential (primary) hypertension: Secondary | ICD-10-CM | POA: Diagnosis not present

## 2021-05-10 DIAGNOSIS — E785 Hyperlipidemia, unspecified: Secondary | ICD-10-CM | POA: Diagnosis not present

## 2021-05-10 DIAGNOSIS — E782 Mixed hyperlipidemia: Secondary | ICD-10-CM | POA: Diagnosis not present

## 2021-05-10 DIAGNOSIS — M1990 Unspecified osteoarthritis, unspecified site: Secondary | ICD-10-CM | POA: Diagnosis not present

## 2021-07-03 DIAGNOSIS — M1711 Unilateral primary osteoarthritis, right knee: Secondary | ICD-10-CM | POA: Diagnosis not present

## 2021-07-03 DIAGNOSIS — M25561 Pain in right knee: Secondary | ICD-10-CM | POA: Diagnosis not present

## 2021-07-10 DIAGNOSIS — I10 Essential (primary) hypertension: Secondary | ICD-10-CM | POA: Diagnosis not present

## 2021-07-10 DIAGNOSIS — E785 Hyperlipidemia, unspecified: Secondary | ICD-10-CM | POA: Diagnosis not present

## 2021-07-10 DIAGNOSIS — M1990 Unspecified osteoarthritis, unspecified site: Secondary | ICD-10-CM | POA: Diagnosis not present

## 2021-07-10 DIAGNOSIS — E782 Mixed hyperlipidemia: Secondary | ICD-10-CM | POA: Diagnosis not present

## 2021-08-06 DIAGNOSIS — Z9189 Other specified personal risk factors, not elsewhere classified: Secondary | ICD-10-CM | POA: Diagnosis not present

## 2021-08-06 DIAGNOSIS — Z01419 Encounter for gynecological examination (general) (routine) without abnormal findings: Secondary | ICD-10-CM | POA: Diagnosis not present

## 2021-08-16 DIAGNOSIS — Z23 Encounter for immunization: Secondary | ICD-10-CM | POA: Diagnosis not present

## 2021-08-19 DIAGNOSIS — M1990 Unspecified osteoarthritis, unspecified site: Secondary | ICD-10-CM | POA: Diagnosis not present

## 2021-08-19 DIAGNOSIS — I10 Essential (primary) hypertension: Secondary | ICD-10-CM | POA: Diagnosis not present

## 2021-08-19 DIAGNOSIS — E782 Mixed hyperlipidemia: Secondary | ICD-10-CM | POA: Diagnosis not present

## 2021-08-28 ENCOUNTER — Other Ambulatory Visit: Payer: Self-pay | Admitting: Family Medicine

## 2021-08-28 DIAGNOSIS — Z1231 Encounter for screening mammogram for malignant neoplasm of breast: Secondary | ICD-10-CM

## 2021-08-30 DIAGNOSIS — Z23 Encounter for immunization: Secondary | ICD-10-CM | POA: Diagnosis not present

## 2021-10-03 DIAGNOSIS — M1990 Unspecified osteoarthritis, unspecified site: Secondary | ICD-10-CM | POA: Diagnosis not present

## 2021-10-03 DIAGNOSIS — E785 Hyperlipidemia, unspecified: Secondary | ICD-10-CM | POA: Diagnosis not present

## 2021-10-03 DIAGNOSIS — I10 Essential (primary) hypertension: Secondary | ICD-10-CM | POA: Diagnosis not present

## 2021-10-03 DIAGNOSIS — E782 Mixed hyperlipidemia: Secondary | ICD-10-CM | POA: Diagnosis not present

## 2021-10-21 ENCOUNTER — Ambulatory Visit
Admission: RE | Admit: 2021-10-21 | Discharge: 2021-10-21 | Disposition: A | Discharge status: Home / Self Care | Payer: Medicare Other | Source: Ambulatory Visit

## 2021-10-21 ENCOUNTER — Other Ambulatory Visit: Payer: Self-pay

## 2021-10-21 DIAGNOSIS — F439 Reaction to severe stress, unspecified: Secondary | ICD-10-CM | POA: Diagnosis not present

## 2021-10-21 DIAGNOSIS — Z1231 Encounter for screening mammogram for malignant neoplasm of breast: Secondary | ICD-10-CM

## 2021-10-21 DIAGNOSIS — Z1389 Encounter for screening for other disorder: Secondary | ICD-10-CM | POA: Diagnosis not present

## 2021-10-21 DIAGNOSIS — Z0000 Encounter for general adult medical examination without abnormal findings: Secondary | ICD-10-CM | POA: Diagnosis not present

## 2021-10-21 DIAGNOSIS — E782 Mixed hyperlipidemia: Secondary | ICD-10-CM | POA: Diagnosis not present

## 2021-10-21 DIAGNOSIS — I10 Essential (primary) hypertension: Secondary | ICD-10-CM | POA: Diagnosis not present

## 2021-12-24 DIAGNOSIS — E782 Mixed hyperlipidemia: Secondary | ICD-10-CM | POA: Diagnosis not present

## 2021-12-24 DIAGNOSIS — I10 Essential (primary) hypertension: Secondary | ICD-10-CM | POA: Diagnosis not present

## 2022-01-21 DIAGNOSIS — E782 Mixed hyperlipidemia: Secondary | ICD-10-CM | POA: Diagnosis not present

## 2022-01-21 DIAGNOSIS — I10 Essential (primary) hypertension: Secondary | ICD-10-CM | POA: Diagnosis not present

## 2022-01-28 DIAGNOSIS — H524 Presbyopia: Secondary | ICD-10-CM | POA: Diagnosis not present

## 2022-01-28 DIAGNOSIS — H52203 Unspecified astigmatism, bilateral: Secondary | ICD-10-CM | POA: Diagnosis not present

## 2022-01-28 DIAGNOSIS — H5212 Myopia, left eye: Secondary | ICD-10-CM | POA: Diagnosis not present

## 2022-01-28 DIAGNOSIS — Z961 Presence of intraocular lens: Secondary | ICD-10-CM | POA: Diagnosis not present

## 2022-03-31 DIAGNOSIS — I10 Essential (primary) hypertension: Secondary | ICD-10-CM | POA: Diagnosis not present

## 2022-03-31 DIAGNOSIS — E782 Mixed hyperlipidemia: Secondary | ICD-10-CM | POA: Diagnosis not present

## 2022-03-31 DIAGNOSIS — F439 Reaction to severe stress, unspecified: Secondary | ICD-10-CM | POA: Diagnosis not present

## 2022-07-26 ENCOUNTER — Encounter (HOSPITAL_BASED_OUTPATIENT_CLINIC_OR_DEPARTMENT_OTHER): Payer: Self-pay

## 2022-07-26 ENCOUNTER — Emergency Department (HOSPITAL_BASED_OUTPATIENT_CLINIC_OR_DEPARTMENT_OTHER)
Admission: EM | Admit: 2022-07-26 | Discharge: 2022-07-26 | Disposition: A | Discharge status: Home / Self Care | Payer: Medicare Other | Attending: Emergency Medicine | Admitting: Emergency Medicine

## 2022-07-26 DIAGNOSIS — H6122 Impacted cerumen, left ear: Secondary | ICD-10-CM | POA: Insufficient documentation

## 2022-07-26 DIAGNOSIS — Z91040 Latex allergy status: Secondary | ICD-10-CM | POA: Insufficient documentation

## 2022-07-26 DIAGNOSIS — U071 COVID-19: Secondary | ICD-10-CM | POA: Diagnosis not present

## 2022-07-26 DIAGNOSIS — R0981 Nasal congestion: Secondary | ICD-10-CM | POA: Diagnosis present

## 2022-07-26 MED ORDER — MOLNUPIRAVIR 200 MG PO CAPS: 4.0000 | ORAL_CAPSULE | Freq: Two times a day (BID) | ORAL | 0 refills | Status: DC

## 2022-07-26 MED ORDER — CARBAMIDE PEROXIDE 6.5 % OT SOLN: 5.0000 [drp] | Freq: Two times a day (BID) | OTIC | 0 refills | Status: AC

## 2022-07-26 MED ORDER — NIRMATRELVIR/RITONAVIR (PAXLOVID)TABLET: 3.0000 | ORAL_TABLET | Freq: Two times a day (BID) | ORAL | 0 refills | Status: AC

## 2022-07-26 NOTE — Discharge Instructions (Signed)
You should quarantine from 10 days from the start of your symptoms.  You report you tested positive for COVID at home.

## 2022-07-26 NOTE — ED Notes (Signed)
Dc instructions reviewed with patient. Patient voiced understanding. Dc with belongings.  °

## 2022-07-26 NOTE — ED Provider Notes (Signed)
[facility] EMERGENCY DEPT Provider Note   CSN: [id number] Arrival date & time: 07/26/22  9233     History  Chief Complaint  Patient presents with   Covid Positive    Lynn Espinoza is a 71 y.o. female here with generalized congestion, headache, stuffy nose for 2 days.  Reports that she tested positive for COVID at home this morning as well as her cousin whom she lives with.  She has had all the vaccine so far.  She has not had COVID before.  HPI     Home Medications Prior to Admission medications   Medication Sig Start Date End Date Taking? Authorizing Provider  carbamide peroxide (DEBROX) 6.5 % OTIC solution Place 5 drops into both ears 2 (two) times daily for 5 days. 07/26/22 07/31/22 Yes Jahmari Esbenshade, Carola Rhine, MD  molnupiravir EUA (LAGEVRIO) 200 MG CAPS capsule Take 4 capsules (800 mg total) by mouth 2 (two) times daily for 5 days. 07/26/22 07/31/22 Yes Derrion Tritz, Carola Rhine, MD  atorvastatin (LIPITOR) 20 MG tablet Take 20 mg by mouth daily.    [provider]  hydrochlorothiazide (MICROZIDE) 12.5 MG capsule Take 12.5 mg by mouth daily.    [provider]  HYDROcodone-acetaminophen (NORCO) 5-325 MG per tablet Take 1 tablet by mouth every 4 (four) hours as needed for moderate pain. 05/22/15   Johnathan Hausen, MD  lisinopril (PRINIVIL,ZESTRIL) 40 MG tablet Take 40 mg by mouth daily.    [provider]  metoprolol succinate (TOPROL-XL) 25 MG 24 hr tablet Take 25 mg by mouth daily.    [provider]  Multiple Vitamins-Minerals (MULTIVITAMIN WITH MINERALS) tablet Take 1 tablet by mouth daily.    [provider]  potassium chloride (KLOR-CON) 8 MEQ tablet Take 8 mEq by mouth daily.    [provider]  Vitamin D, Ergocalciferol, (DRISDOL) 50000 UNITS CAPS capsule Take 50,000 Units by mouth every 7 (seven) days.    [provider]      Allergies    Fentanyl and Latex    Review of Systems   Review of Systems  Physical  Exam Updated Vital Signs BP (!) 182/80   Pulse 68   Temp 98.2 F (36.8 C) (Oral)   Resp 16   Ht '5\' 6"'$  (1.676 m)   Wt 68 kg   SpO2 99%   BMI 24.21 kg/m  Physical Exam Constitutional:      General: She is not in acute distress. HENT:     Head: Normocephalic and atraumatic.     Comments: TMs obscured by cerumen Eyes:     Conjunctiva/sclera: Conjunctivae normal.     Pupils: Pupils are equal, round, and reactive to light.  Cardiovascular:     Rate and Rhythm: Normal rate and regular rhythm.  Pulmonary:     Effort: Pulmonary effort is normal. No respiratory distress.  Abdominal:     General: There is no distension.     Tenderness: There is no abdominal tenderness.  Skin:    General: Skin is warm and dry.  Neurological:     General: No focal deficit present.     Mental Status: She is alert. Mental status is at baseline.  Psychiatric:        Mood and Affect: Mood normal.        Behavior: Behavior normal.     ED Results / Procedures / Treatments   Labs (all labs ordered are listed, but only abnormal results are displayed) Labs Reviewed - No data to  display  EKG None  Radiology No results found.  Procedures Procedures    Medications Ordered in ED Medications - No data to display  ED Course/ Medical Decision Making/ A&P                           Medical Decision Making  Patient is here with viral syndrome, day 1 or 2 of COVID infection.  Given her age and comorbidities, I do think she is a candidate for antiviral therapy, will try molnupiravir as it is a limited medication interactions and side effects, she is on beta-blocker.  She appears well, is breathing comfortably, not hypoxic, and there is no indication for hospitalization at this time.  Quarantine measures were discussed.  SHARLYNE KOENEMAN was evaluated in Emergency Department on 07/26/2022 for the symptoms described in the history of present illness. She was evaluated in the context of the global COVID-19  pandemic, which necessitated consideration that the patient might be at risk for infection with the SARS-CoV-2 virus that causes COVID-19. Institutional protocols and algorithms that pertain to the evaluation of patients at risk for COVID-19 are in a state of rapid change based on information released by regulatory bodies including the CDC and federal and state organizations. These policies and algorithms were followed during the patient's care in the ED.         Final Clinical Impression(s) / ED Diagnoses Final diagnoses:  COVID-19  Impacted cerumen of left ear    Rx / DC Orders ED Discharge Orders          Ordered    molnupiravir EUA (LAGEVRIO) 200 MG CAPS capsule  2 times daily        07/26/22 1126    carbamide peroxide (DEBROX) 6.5 % OTIC solution  2 times daily        07/26/22 1126              Junie Engram, Carola Rhine, MD 07/26/22 1127

## 2022-07-26 NOTE — ED Triage Notes (Signed)
Pt states she had a positive home COVID test this morning. Pt c/o ear and left neck gland swelling.    Pt has not taken BP meds this morning.

## 2022-07-26 NOTE — ED Provider Notes (Signed)
Lynn Espinoza called to report they do not have molnupiravir.  They do a Paxlovid.  I confirmed in fact that metoprolol is safe to give with Paxlovid.  Patient advised to hold her statin medication while on paxlovid   Wyvonnia Dusky, MD 07/26/22 1133

## 2022-07-31 DIAGNOSIS — Z20822 Contact with and (suspected) exposure to covid-19: Secondary | ICD-10-CM | POA: Diagnosis not present

## 2022-08-01 DIAGNOSIS — Z20822 Contact with and (suspected) exposure to covid-19: Secondary | ICD-10-CM | POA: Diagnosis not present

## 2022-08-04 DIAGNOSIS — Z20822 Contact with and (suspected) exposure to covid-19: Secondary | ICD-10-CM | POA: Diagnosis not present

## 2022-08-05 DIAGNOSIS — Z20822 Contact with and (suspected) exposure to covid-19: Secondary | ICD-10-CM | POA: Diagnosis not present

## 2022-08-05 DIAGNOSIS — U071 COVID-19: Secondary | ICD-10-CM | POA: Diagnosis not present

## 2022-08-08 DIAGNOSIS — Z20822 Contact with and (suspected) exposure to covid-19: Secondary | ICD-10-CM | POA: Diagnosis not present

## 2022-08-09 DIAGNOSIS — Z20822 Contact with and (suspected) exposure to covid-19: Secondary | ICD-10-CM | POA: Diagnosis not present

## 2022-08-12 DIAGNOSIS — Z20822 Contact with and (suspected) exposure to covid-19: Secondary | ICD-10-CM | POA: Diagnosis not present

## 2022-08-13 DIAGNOSIS — Z20822 Contact with and (suspected) exposure to covid-19: Secondary | ICD-10-CM | POA: Diagnosis not present

## 2022-08-16 DIAGNOSIS — Z20822 Contact with and (suspected) exposure to covid-19: Secondary | ICD-10-CM | POA: Diagnosis not present

## 2022-08-17 DIAGNOSIS — Z20822 Contact with and (suspected) exposure to covid-19: Secondary | ICD-10-CM | POA: Diagnosis not present

## 2022-08-25 DIAGNOSIS — Z23 Encounter for immunization: Secondary | ICD-10-CM | POA: Diagnosis not present

## 2022-09-18 ENCOUNTER — Other Ambulatory Visit: Payer: Self-pay | Admitting: Family Medicine

## 2022-09-18 DIAGNOSIS — Z1231 Encounter for screening mammogram for malignant neoplasm of breast: Secondary | ICD-10-CM

## 2022-11-03 DIAGNOSIS — E782 Mixed hyperlipidemia: Secondary | ICD-10-CM | POA: Diagnosis not present

## 2022-11-03 DIAGNOSIS — F439 Reaction to severe stress, unspecified: Secondary | ICD-10-CM | POA: Diagnosis not present

## 2022-11-03 DIAGNOSIS — Z0000 Encounter for general adult medical examination without abnormal findings: Secondary | ICD-10-CM | POA: Diagnosis not present

## 2022-11-03 DIAGNOSIS — I10 Essential (primary) hypertension: Secondary | ICD-10-CM | POA: Diagnosis not present

## 2022-11-03 DIAGNOSIS — Z1331 Encounter for screening for depression: Secondary | ICD-10-CM | POA: Diagnosis not present

## 2022-11-14 ENCOUNTER — Ambulatory Visit
Admission: RE | Admit: 2022-11-14 | Discharge: 2022-11-14 | Disposition: A | Discharge status: Home / Self Care | Payer: Medicare Other | Source: Ambulatory Visit | Attending: Family Medicine | Admitting: Family Medicine

## 2022-11-14 DIAGNOSIS — Z1231 Encounter for screening mammogram for malignant neoplasm of breast: Secondary | ICD-10-CM | POA: Diagnosis not present

## 2023-05-05 DIAGNOSIS — E782 Mixed hyperlipidemia: Secondary | ICD-10-CM | POA: Diagnosis not present

## 2023-05-05 DIAGNOSIS — Z23 Encounter for immunization: Secondary | ICD-10-CM | POA: Diagnosis not present

## 2023-05-05 DIAGNOSIS — F439 Reaction to severe stress, unspecified: Secondary | ICD-10-CM | POA: Diagnosis not present

## 2023-05-05 DIAGNOSIS — I10 Essential (primary) hypertension: Secondary | ICD-10-CM | POA: Diagnosis not present

## 2023-05-12 DIAGNOSIS — M1711 Unilateral primary osteoarthritis, right knee: Secondary | ICD-10-CM | POA: Diagnosis not present

## 2023-05-12 DIAGNOSIS — M25561 Pain in right knee: Secondary | ICD-10-CM | POA: Diagnosis not present

## 2023-05-12 DIAGNOSIS — S93602A Unspecified sprain of left foot, initial encounter: Secondary | ICD-10-CM | POA: Diagnosis not present

## 2023-06-30 ENCOUNTER — Other Ambulatory Visit: Payer: Self-pay

## 2023-06-30 ENCOUNTER — Emergency Department (HOSPITAL_COMMUNITY): Payer: Medicare Other

## 2023-06-30 ENCOUNTER — Emergency Department (HOSPITAL_COMMUNITY)
Admission: EM | Admit: 2023-06-30 | Discharge: 2023-06-30 | Disposition: A | Discharge status: Home / Self Care | Payer: Medicare Other | Attending: Emergency Medicine | Admitting: Emergency Medicine

## 2023-06-30 DIAGNOSIS — R4182 Altered mental status, unspecified: Secondary | ICD-10-CM | POA: Diagnosis not present

## 2023-06-30 DIAGNOSIS — R519 Headache, unspecified: Secondary | ICD-10-CM | POA: Diagnosis not present

## 2023-06-30 DIAGNOSIS — E876 Hypokalemia: Secondary | ICD-10-CM | POA: Diagnosis not present

## 2023-06-30 DIAGNOSIS — Z743 Need for continuous supervision: Secondary | ICD-10-CM | POA: Diagnosis not present

## 2023-06-30 DIAGNOSIS — I10 Essential (primary) hypertension: Secondary | ICD-10-CM | POA: Insufficient documentation

## 2023-06-30 DIAGNOSIS — R918 Other nonspecific abnormal finding of lung field: Secondary | ICD-10-CM | POA: Diagnosis not present

## 2023-06-30 DIAGNOSIS — R61 Generalized hyperhidrosis: Secondary | ICD-10-CM | POA: Diagnosis not present

## 2023-06-30 DIAGNOSIS — G4489 Other headache syndrome: Secondary | ICD-10-CM | POA: Diagnosis not present

## 2023-06-30 DIAGNOSIS — I517 Cardiomegaly: Secondary | ICD-10-CM | POA: Diagnosis not present

## 2023-06-30 DIAGNOSIS — I959 Hypotension, unspecified: Secondary | ICD-10-CM | POA: Diagnosis not present

## 2023-06-30 DIAGNOSIS — R739 Hyperglycemia, unspecified: Secondary | ICD-10-CM | POA: Diagnosis not present

## 2023-06-30 DIAGNOSIS — R070 Pain in throat: Secondary | ICD-10-CM | POA: Diagnosis not present

## 2023-06-30 DIAGNOSIS — R778 Other specified abnormalities of plasma proteins: Secondary | ICD-10-CM | POA: Diagnosis not present

## 2023-06-30 DIAGNOSIS — U071 COVID-19: Secondary | ICD-10-CM

## 2023-06-30 DIAGNOSIS — R531 Weakness: Secondary | ICD-10-CM | POA: Diagnosis not present

## 2023-06-30 DIAGNOSIS — Z79899 Other long term (current) drug therapy: Secondary | ICD-10-CM | POA: Insufficient documentation

## 2023-06-30 DIAGNOSIS — R7989 Other specified abnormal findings of blood chemistry: Secondary | ICD-10-CM

## 2023-06-30 LAB — COMPREHENSIVE METABOLIC PANEL
ALT: 35 U/L (ref 0–44)
AST: 42 U/L — ABNORMAL HIGH (ref 15–41)
Albumin: 3.8 g/dL (ref 3.5–5.0)
Alkaline Phosphatase: 86 U/L (ref 38–126)
Anion gap: 10 (ref 5–15)
BUN: 29 mg/dL — ABNORMAL HIGH (ref 8–23)
CO2: 32 mmol/L (ref 22–32)
Calcium: 9.2 mg/dL (ref 8.9–10.3)
Chloride: 102 mmol/L (ref 98–111)
Creatinine, Ser: 0.86 mg/dL (ref 0.44–1.00)
GFR, Estimated: >60 mL/min (ref >60)
Glucose, Bld: 122 mg/dL — ABNORMAL HIGH (ref 70–99)
Potassium: 3.1 mmol/L — ABNORMAL LOW (ref 3.5–5.1)
Sodium: 144 mmol/L (ref 135–145)
Total Bilirubin: 0.8 mg/dL (ref 0.3–1.2)
Total Protein: 7.7 g/dL (ref 6.5–8.1)

## 2023-06-30 LAB — I-STAT CG4 LACTIC ACID, ED: Lactic Acid, Venous: 1.4 mmol/L (ref 0.5–1.9)

## 2023-06-30 LAB — URINALYSIS, ROUTINE W REFLEX MICROSCOPIC
Bilirubin Urine: NEGATIVE
Glucose, UA: NEGATIVE mg/dL
Hgb urine dipstick: NEGATIVE
Ketones, ur: NEGATIVE mg/dL
Leukocytes,Ua: NEGATIVE
Nitrite: NEGATIVE
Protein, ur: NEGATIVE mg/dL
Specific Gravity, Urine: <1.005 — ABNORMAL LOW (ref 1.005–1.030)
pH: 5.5 (ref 5.0–8.0)

## 2023-06-30 LAB — CBC WITH DIFFERENTIAL/PLATELET
Abs Immature Granulocytes: 0.03 10*3/uL (ref 0.00–0.07)
Basophils Absolute: 0 10*3/uL (ref 0.0–0.1)
Basophils Relative: 0 %
Eosinophils Absolute: 0 10*3/uL (ref 0.0–0.5)
Eosinophils Relative: 0 %
HCT: 43 % (ref 36.0–46.0)
Hemoglobin: 13.5 g/dL (ref 12.0–15.0)
Immature Granulocytes: 0 %
Lymphocytes Relative: 9 %
Lymphs Abs: 0.9 10*3/uL (ref 0.7–4.0)
MCH: 28.8 pg (ref 26.0–34.0)
MCHC: 31.4 g/dL (ref 30.0–36.0)
MCV: 91.9 fL (ref 80.0–100.0)
Monocytes Absolute: 1 10*3/uL (ref 0.1–1.0)
Monocytes Relative: 10 %
Neutro Abs: 8.3 10*3/uL — ABNORMAL HIGH (ref 1.7–7.7)
Neutrophils Relative %: 81 %
Platelets: 199 10*3/uL (ref 150–400)
RBC: 4.68 MIL/uL (ref 3.87–5.11)
RDW: 13.2 % (ref 11.5–15.5)
WBC: 10.3 10*3/uL (ref 4.0–10.5)
nRBC: 0 % (ref 0.0–0.2)

## 2023-06-30 LAB — SARS CORONAVIRUS 2 BY RT PCR: SARS Coronavirus 2 by RT PCR: POSITIVE — AB

## 2023-06-30 LAB — LIPASE, BLOOD: Lipase: 35 U/L (ref 11–51)

## 2023-06-30 LAB — TROPONIN I (HIGH SENSITIVITY)
Troponin I (High Sensitivity): 38 ng/L — ABNORMAL HIGH (ref <18)
Troponin I (High Sensitivity): 65 ng/L — ABNORMAL HIGH (ref <18)

## 2023-06-30 LAB — GROUP A STREP BY PCR: Group A Strep by PCR: NOT DETECTED

## 2023-06-30 MED ORDER — POTASSIUM CHLORIDE CRYS ER 20 MEQ PO TBCR
40.0000 meq | EXTENDED_RELEASE_TABLET | Freq: Once | ORAL | Status: AC
  Administered 2023-06-30: 40 meq via ORAL
  Filled 2023-06-30: qty 2

## 2023-06-30 MED ORDER — IOHEXOL 350 MG/ML SOLN
80.0000 mL | Freq: Once | INTRAVENOUS | Status: AC | PRN
  Administered 2023-06-30: 80 mL via INTRAVENOUS

## 2023-06-30 MED ORDER — SODIUM CHLORIDE 0.9 % IV BOLUS
1000.0000 mL | Freq: Once | INTRAVENOUS | Status: AC
  Administered 2023-06-30: 1000 mL via INTRAVENOUS

## 2023-06-30 MED ORDER — SODIUM CHLORIDE (PF) 0.9 % IJ SOLN
INTRAMUSCULAR | Status: AC
  Filled 2023-06-30: qty 50

## 2023-06-30 NOTE — ED Notes (Signed)
Pt aware urine sample needed 

## 2023-06-30 NOTE — ED Provider Notes (Signed)
[place] EMERGENCY DEPARTMENT AT [facility] Provider Note   CSN: [id number] Arrival date & time: 06/30/23  1017     History  Chief Complaint  Patient presents with   Hypotension    Lynn Espinoza is a 72 y.o. female with a past medical history significant for hypertension and hyperlipidemia who presents to the ED from PCP office due to hypotension, nausea, vomiting, and diaphoresis.  Admits to URI symptoms over the past few days.  Admits to rhinorrhea, sneezing, and sore throat.  No sick contacts.  Lives with granddaughter.  Patient presented to PCP office for a checkup and was found to be hypotensive at 72/51. Per daughter, patient was given IVFs at PCP and sent to the ED for further evaluation. While at the PCP office, patient had an episode of confusion; however daughter notes patient is back at baseline. Patient denies chest pain and shortness of breath. Admits to a slight headache. No dizziness, speech or visual changes. Denies unilateral weakness. Admits to decreased po intake over the past few days. She has been taking Mucinex for her URI symptoms. Denies and urinary symptoms.   History obtained from patient and past medical records. No interpreter used during encounter.       Home Medications Prior to Admission medications   Medication Sig Start Date End Date Taking? Authorizing Provider  atorvastatin (LIPITOR) 20 MG tablet Take 20 mg by mouth daily.    [provider]  hydrochlorothiazide (MICROZIDE) 12.5 MG capsule Take 12.5 mg by mouth daily.    [provider]  HYDROcodone-acetaminophen (NORCO) 5-325 MG per tablet Take 1 tablet by mouth every 4 (four) hours as needed for moderate pain. 05/22/15   Luretha Murphy, MD  lisinopril (PRINIVIL,ZESTRIL) 40 MG tablet Take 40 mg by mouth daily.    [provider]  metoprolol succinate (TOPROL-XL) 25 MG 24 hr tablet Take 25 mg by mouth daily.    [provider]  Multiple  Vitamins-Minerals (MULTIVITAMIN WITH MINERALS) tablet Take 1 tablet by mouth daily.    [provider]  potassium chloride (KLOR-CON) 8 MEQ tablet Take 8 mEq by mouth daily.    [provider]  Vitamin D, Ergocalciferol, (DRISDOL) 50000 UNITS CAPS capsule Take 50,000 Units by mouth every 7 (seven) days.    [provider]      Allergies    Fentanyl and Latex    Review of Systems   Review of Systems  Constitutional:  Negative for fever.  HENT:  Positive for congestion, rhinorrhea and sore throat.   Respiratory:  Negative for shortness of breath.   Cardiovascular:  Negative for chest pain.  Gastrointestinal:  Positive for nausea and vomiting. Negative for abdominal pain.  Genitourinary:  Negative for dysuria.    Physical Exam Updated Vital Signs BP 137/76   Pulse 66   Temp 97.8 F (36.6 C)   Resp 17   Ht 5\' 6"  (1.676 m)   Wt 73.5 kg   SpO2 100%   BMI 26.15 kg/m  Physical Exam Vitals and nursing note reviewed.  Constitutional:      General: She is not in acute distress.    Appearance: She is not ill-appearing.  HENT:     Head: Normocephalic.     Nose: Rhinorrhea present.  Eyes:     Pupils: Pupils are equal, round, and reactive to light.  Cardiovascular:     Rate and Rhythm: Normal rate and regular rhythm.     Pulses: Normal pulses.  Heart sounds: Normal heart sounds. No murmur heard.    No friction rub. No gallop.  Pulmonary:     Effort: Pulmonary effort is normal.     Breath sounds: Normal breath sounds.  Abdominal:     General: Abdomen is flat. There is no distension.     Palpations: Abdomen is soft.     Tenderness: There is no abdominal tenderness. There is no guarding or rebound.  Musculoskeletal:        General: Normal range of motion.     Cervical back: Neck supple.  Skin:    General: Skin is warm and dry.  Neurological:     General: No focal deficit present.     Mental Status: She is alert.  Psychiatric:        Mood and  Affect: Mood normal.        Behavior: Behavior normal.     ED Results / Procedures / Treatments   Labs (all labs ordered are listed, but only abnormal results are displayed) Labs Reviewed  SARS CORONAVIRUS 2 BY RT PCR - Abnormal; Notable for the following components:      Result Value   SARS Coronavirus 2 by RT PCR POSITIVE (*)    All other components within normal limits  CBC WITH DIFFERENTIAL/PLATELET - Abnormal; Notable for the following components:   Neutro Abs 8.3 (*)    All other components within normal limits  COMPREHENSIVE METABOLIC PANEL - Abnormal; Notable for the following components:   Potassium 3.1 (*)    Glucose, Bld 122 (*)    BUN 29 (*)    AST 42 (*)    All other components within normal limits  TROPONIN I (HIGH SENSITIVITY) - Abnormal; Notable for the following components:   Troponin I (High Sensitivity) 38 (*)    All other components within normal limits  GROUP A STREP BY PCR  LIPASE, BLOOD  URINALYSIS, ROUTINE W REFLEX MICROSCOPIC  I-STAT CG4 LACTIC ACID, ED  TROPONIN I (HIGH SENSITIVITY)    EKG EKG Interpretation Date/Time:  Tuesday June 30 2023 11:35:38 EDT Ventricular Rate:  64 PR Interval:  193 QRS Duration:  86 QT Interval:  409 QTC Calculation: 422 R Axis:   24  Text Interpretation: Sinus rhythm Probable left atrial enlargement Abnormal R-wave progression, early transition Nonspecific T abnormalities, diffuse leads Confirmed by Rolan Bucco [phone]) on 06/30/2023 2:16:16 PM  Radiology CT Head Wo Contrast  Result Date: 06/30/2023 CLINICAL DATA:  Headache and sore throat EXAM: CT HEAD WITHOUT CONTRAST TECHNIQUE: Contiguous axial images were obtained from the base of the skull through the vertex without intravenous contrast. RADIATION DOSE REDUCTION: This exam was performed according to the departmental dose-optimization program which includes automated exposure control, adjustment of the mA and/or kV according to patient size and/or use of  iterative reconstruction technique. COMPARISON:  None Available. FINDINGS: Brain: There is no acute intracranial hemorrhage, extra-axial fluid collection, or acute infarct. Parenchymal volume is normal. The ventricles are normal in size. Gray-white differentiation is preserved. There is no mass lesion. There is no mass effect or midline shift. The pituitary and suprasellar region are normal. Vascular: No hyperdense vessel or unexpected calcification. Skull: Normal. Negative for fracture or focal lesion. Sinuses/Orbits: There is mild mucosal thickening in the paranasal sinuses. Bilateral lens implants are in place. The globes and orbits are otherwise unremarkable. Other: The mastoid air cells and middle ear cavities are clear. IMPRESSION: No acute intracranial pathology. Electronically Signed   By: Selena Lesser.D.  On: 06/30/2023 12:11   DG Chest Portable 1 View  Result Date: 06/30/2023 CLINICAL DATA:  Hypotension EXAM: PORTABLE CHEST 1 VIEW COMPARISON:  Chest radiograph 01/12/2006, CT chest 03/28/2015 FINDINGS: The cardiomediastinal silhouette is normal There is no focal consolidation or pulmonary edema. There is no pleural effusion or pneumothorax There is no acute osseous abnormality. IMPRESSION: No radiographic evidence of acute cardiopulmonary process. Electronically Signed   By: Lesia Hausen M.D.   On: 06/30/2023 12:06    Procedures Procedures    Medications Ordered in ED Medications  sodium chloride 0.9 % bolus 1,000 mL (1,000 mLs Intravenous New Bag/Given 06/30/23 1223)  potassium chloride SA (KLOR-CON M) CR tablet 40 mEq (40 mEq Oral Given 06/30/23 1425)  iohexol (OMNIPAQUE) 350 MG/ML injection 80 mL (80 mLs Intravenous Contrast Given 06/30/23 1458)    ED Course/ Medical Decision Making/ A&P Clinical Course as of 06/30/23 1523  Tue Jun 30, 2023  1249 SARS Coronavirus 2 by RT PCR(!): POSITIVE [CA]    Clinical Course User Index [CA] Mannie Stabile, PA-C                                  Medical Decision Making Amount and/or Complexity of Data Reviewed Independent Historian: caregiver    Details: Daughter provided history External Data Reviewed: notes.    Details: PCP note Labs: ordered. Decision-making details documented in ED Course. Radiology: ordered and independent interpretation performed. Decision-making details documented in ED Course. ECG/medicine tests: ordered and independent interpretation performed. Decision-making details documented in ED Course.  Risk Prescription drug management.   This patient presents to the ED for concern of hypotension, this involves an extensive number of treatment options, and is a complaint that carries with it a high risk of complications and morbidity.  The differential diagnosis includes dehydration, COVID, cardiac etiology, infection, etc  72 year old female presents to the ED from PCP office due to hypotension, nausea, vomiting, and diaphoresis.  Patient has had URI symptoms for the past few days.  Daughter at bedside states patient was slightly confused at PCP office however, is now at baseline.  Patient admits to decreased p.o. intake over the past few days.  No chest pain or shortness of breath.  Denies urinary symptoms.  Upon arrival, vitals all within normal limits.  Patient in no acute distress.  Reassuring physical exam.  Lungs clear to auscultation bilaterally.  Abdomen soft, nondistended, nontender.  Normal neurological exam without any neurological deficits. No lower extremity edema. No evidence of DVT on exam. Routine labs ordered.  Cardiac labs to rule out cardiac etiology of nausea, vomiting, and diaphoresis. Possible COVID infection vs. Viral etiology? COVID/influenza test ordered.  Chest x-ray to rule out evidence of pneumonia.  UA to rule out infection.  IV fluids given.  CBC reassuring.  No leukocytosis.  Normal hemoglobin.  CMP significant for hypokalemia 3.1.  Potassium repleted.  Normal renal function. Lactic acid  normal. Lipase normal. Troponin elevated at 38. EKG NSR with new t-wave inversion from 2017. Will discuss with cardiology. Patient denies any chest pain or shortness of breath. CXR personally reviewed and interpreted negative for signs of pneumonia, pneumothorax, or widened mediastinum.  3:18 PM Discussed with Dr. Izora Ribas with cardiology who believes elevated troponin likely secondary to demand ischemia from COVID-19. If 2nd troponin is flat or lower and CTA chest is normal, patient is cleared from a cardiac standpoint. He recommends cardiology referral and once she  improves from COVID to have cardiac testing.   Patient handed off to Maxwell Marion, PA-C at shift change pending 2nd troponin and CTA chest. If troponin is flat/decreased and she can ambulate with pulse ox >95% she may be discharged home with PCP follow-up in 2-3 days.   Lives at home Has PCP Hx HTN  Discussed with Dr. Fredderick Phenix who agrees with assessment and plan.       Final Clinical Impression(s) / ED Diagnoses Final diagnoses:  COVID-19 virus infection    Rx / DC Orders ED Discharge Orders     None         Mannie Stabile, PA-C 06/30/23 1525    Rolan Bucco, MD 06/30/23 1553

## 2023-06-30 NOTE — ED Notes (Signed)
Ambulate patient Completed: Pt was able to tolerate walking around unit well Sp02 was at 100% never fell below 98%, no labored breathing with normal walking gait.

## 2023-06-30 NOTE — ED Triage Notes (Signed)
EMS brings patient from Doctor's office with a compaint of a headache and a sore throat. EMS called due to hypotension 72/51, n/v, and sweating. AAOx3  EMS BP: 132/77  Elevated CBG with no Hx of DM: 263

## 2023-06-30 NOTE — ED Provider Notes (Signed)
  Physical Exam  BP 137/76   Pulse 66   Temp 97.8 F (36.6 C)   Resp 17   Ht 5\' 6"  (1.676 m)   Wt 73.5 kg   SpO2 100%   BMI 26.15 kg/m   Physical Exam  Procedures  Procedures  ED Course / MDM   Clinical Course as of 06/30/23 1619  Tue Jun 30, 2023  1249 SARS Coronavirus 2 by RT PCR(!): POSITIVE [CA]    Clinical Course User Index [CA] Mannie Stabile, PA-C   Medical Decision Making Amount and/or Complexity of Data Reviewed Labs: ordered. Decision-making details documented in ED Course. Radiology: ordered.  Risk Prescription drug management.   I picked up this patient from Claudette Stapler, PA-C, at time of shift change.  Second troponin and CTA pending prior to discharge home.   Patient's initial troponin was 35. Second troponin was 65.  CTA showed atelectasis or scarring at lung bases bilaterally, otherwise unremarkable.  I consulted cardiology to ensure, it's safe for patient to be discharged home even with the slight elevation. I spoke with Dr. Izora Ribas, who believes that the elevated troponin is due to myocardial strain secondary to COVID.  He advises patient follow-up with cardiology in 1 week after resolution of COVID for further evaluation.  I have sent a referral for Dr. Armanda Magic, per patient's request.  Patient ambulated around the room with an O2 sat of 93%.  She denies any shortness of breath but reports congestion.  She is stable and safe for discharge home.  Return precautions provided.    Maxwell Marion, PA-C 06/30/23 2329    Linwood Dibbles, MD 07/02/23 [phone]

## 2023-06-30 NOTE — ED Notes (Signed)
Ambulated pt with SpO2, pt stated, " I feel alright. I am still congested still." SpO2 started at 100 and stayed that and 93 %. HR started at 80 and stayed between that and 73.

## 2023-06-30 NOTE — ED Notes (Signed)
Pt aware urine sample has been needed.

## 2023-06-30 NOTE — Discharge Instructions (Addendum)
As discussed, your labs showed an elevated troponin which is most likely due to heart strain secondary to COVID infection. Your other labs were unremarkable. Your CTA did not show signs of pulmonary embolism or other acute findings.  A referral to cardiology, Dr. Gloris Manchester Turner's office, has been sent.  They should give you a call in a couple days to schedule an appointment for after the resolution of your COVID symptoms.  Make sure you're drinking plenty of fluids with electrolytes and staying hydrated while you're sick. If you develop a fever you can alternate between Tylenol and Ibuprofen every 4 hours as needed.  Get help right away if: You have trouble breathing or get short of breath. You have pain or pressure in your chest. You cannot speak or move any part of your body. You are confused. Your symptoms get worse.

## 2023-07-07 ENCOUNTER — Telehealth: Payer: Self-pay

## 2023-07-07 NOTE — Telephone Encounter (Signed)
Transition Care Management Unsuccessful Follow-up Telephone Call  Date of discharge and from where:  [facility]  Attempts:  1st Attempt  Reason for unsuccessful TCM follow-up call:  No answer/busy   Lenard Forth [facility] Guide, [facility] [phone] [address] [place], [place], [place] [address] Phone: [phone] Email: [email or web address]

## 2023-07-08 ENCOUNTER — Telehealth: Payer: Self-pay

## 2023-07-08 NOTE — Telephone Encounter (Signed)
Transition Care Management Follow-up Telephone Call Date of discharge and from where: [facility] How have you been since you were released from the hospital? Doing good, resting and drinking fluids due to Covid and going to call to follow up with PCP next week  Any questions or concerns? No  Items Reviewed: Did the pt receive and understand the discharge instructions provided? Yes  Medications obtained and verified? No  Other? No  Any new allergies since your discharge? No  Dietary orders reviewed? No Do you have support at home? Yes    Follow up appointments reviewed:  PCP Hospital f/u appt confirmed? No  Scheduled to see  on  @ . Specialist Hospital f/u appt confirmed? No  Scheduled to see  on  @ . Are transportation arrangements needed? Yes  If their condition worsens, is the pt aware to call PCP or go to the Emergency Dept.? Yes Was the patient provided with contact information for the PCP's office or ED? Yes Was to pt encouraged to call back with questions or concerns? Yes    Lenard Forth [facility] Guide, [facility] [phone] [address] [place], [place], [place] [address] Phone: [phone] Email: [email or web address]

## 2023-08-14 DIAGNOSIS — Z23 Encounter for immunization: Secondary | ICD-10-CM | POA: Diagnosis not present

## 2023-09-02 DIAGNOSIS — R87619 Unspecified abnormal cytological findings in specimens from cervix uteri: Secondary | ICD-10-CM | POA: Diagnosis not present

## 2023-09-02 DIAGNOSIS — Z9189 Other specified personal risk factors, not elsewhere classified: Secondary | ICD-10-CM | POA: Diagnosis not present

## 2023-09-02 DIAGNOSIS — I10 Essential (primary) hypertension: Secondary | ICD-10-CM | POA: Diagnosis not present

## 2023-12-03 DIAGNOSIS — F439 Reaction to severe stress, unspecified: Secondary | ICD-10-CM | POA: Diagnosis not present

## 2023-12-03 DIAGNOSIS — Z0000 Encounter for general adult medical examination without abnormal findings: Secondary | ICD-10-CM | POA: Diagnosis not present

## 2023-12-03 DIAGNOSIS — Z23 Encounter for immunization: Secondary | ICD-10-CM | POA: Diagnosis not present

## 2023-12-03 DIAGNOSIS — Z13820 Encounter for screening for osteoporosis: Secondary | ICD-10-CM | POA: Diagnosis not present

## 2023-12-03 DIAGNOSIS — E782 Mixed hyperlipidemia: Secondary | ICD-10-CM | POA: Diagnosis not present

## 2023-12-03 DIAGNOSIS — Z1331 Encounter for screening for depression: Secondary | ICD-10-CM | POA: Diagnosis not present

## 2023-12-03 DIAGNOSIS — I10 Essential (primary) hypertension: Secondary | ICD-10-CM | POA: Diagnosis not present

## 2023-12-08 ENCOUNTER — Other Ambulatory Visit: Payer: Self-pay | Admitting: Family Medicine

## 2023-12-08 DIAGNOSIS — E2839 Other primary ovarian failure: Secondary | ICD-10-CM

## 2023-12-16 ENCOUNTER — Other Ambulatory Visit: Payer: Self-pay | Admitting: Family Medicine

## 2023-12-16 DIAGNOSIS — Z1231 Encounter for screening mammogram for malignant neoplasm of breast: Secondary | ICD-10-CM

## 2023-12-17 ENCOUNTER — Ambulatory Visit
Admission: RE | Admit: 2023-12-17 | Discharge: 2023-12-17 | Disposition: A | Discharge status: Home / Self Care | Payer: 59 | Source: Ambulatory Visit | Attending: Family Medicine | Admitting: Family Medicine

## 2023-12-17 DIAGNOSIS — E2839 Other primary ovarian failure: Secondary | ICD-10-CM

## 2024-01-14 ENCOUNTER — Ambulatory Visit: Payer: Medicare Other

## 2024-01-22 ENCOUNTER — Emergency Department (HOSPITAL_BASED_OUTPATIENT_CLINIC_OR_DEPARTMENT_OTHER)
Admission: EM | Admit: 2024-01-22 | Discharge: 2024-01-22 | Discharge status: Against Medical Advice | Payer: 59 | Attending: Emergency Medicine | Admitting: Emergency Medicine

## 2024-01-22 ENCOUNTER — Encounter (HOSPITAL_BASED_OUTPATIENT_CLINIC_OR_DEPARTMENT_OTHER): Payer: Self-pay | Admitting: Emergency Medicine

## 2024-01-22 ENCOUNTER — Emergency Department (HOSPITAL_BASED_OUTPATIENT_CLINIC_OR_DEPARTMENT_OTHER): Payer: 59

## 2024-01-22 ENCOUNTER — Other Ambulatory Visit: Payer: Self-pay

## 2024-01-22 DIAGNOSIS — S0181XA Laceration without foreign body of other part of head, initial encounter: Secondary | ICD-10-CM | POA: Insufficient documentation

## 2024-01-22 DIAGNOSIS — W1830XA Fall on same level, unspecified, initial encounter: Secondary | ICD-10-CM | POA: Insufficient documentation

## 2024-01-22 DIAGNOSIS — Z5321 Procedure and treatment not carried out due to patient leaving prior to being seen by health care provider: Secondary | ICD-10-CM | POA: Insufficient documentation

## 2024-01-22 NOTE — ED Notes (Signed)
 No answer when called to room

## 2024-01-22 NOTE — ED Triage Notes (Signed)
 Mechanical fall today around 1400. Struck chin on ground. Small lac to chin that was glued by PCP. Sent for CT scan. Denies LOC. Denies thinner. No focal deficits.

## 2024-01-22 NOTE — ED Notes (Signed)
No answer when called to treatment room.  

## 2024-02-02 ENCOUNTER — Ambulatory Visit
Admission: RE | Admit: 2024-02-02 | Discharge: 2024-02-02 | Disposition: A | Discharge status: Home / Self Care | Payer: Medicare Other | Source: Ambulatory Visit | Attending: Family Medicine | Admitting: Family Medicine

## 2024-02-02 DIAGNOSIS — Z1231 Encounter for screening mammogram for malignant neoplasm of breast: Secondary | ICD-10-CM

## 2024-03-15 DIAGNOSIS — I10 Essential (primary) hypertension: Secondary | ICD-10-CM | POA: Diagnosis not present

## 2024-03-23 DIAGNOSIS — I10 Essential (primary) hypertension: Secondary | ICD-10-CM | POA: Diagnosis not present

## 2024-03-23 DIAGNOSIS — E782 Mixed hyperlipidemia: Secondary | ICD-10-CM | POA: Diagnosis not present

## 2024-04-14 DIAGNOSIS — I10 Essential (primary) hypertension: Secondary | ICD-10-CM | POA: Diagnosis not present

## 2024-04-23 DIAGNOSIS — E782 Mixed hyperlipidemia: Secondary | ICD-10-CM | POA: Diagnosis not present

## 2024-04-23 DIAGNOSIS — I10 Essential (primary) hypertension: Secondary | ICD-10-CM | POA: Diagnosis not present

## 2024-05-14 DIAGNOSIS — I10 Essential (primary) hypertension: Secondary | ICD-10-CM | POA: Diagnosis not present

## 2024-05-23 DIAGNOSIS — E782 Mixed hyperlipidemia: Secondary | ICD-10-CM | POA: Diagnosis not present

## 2024-05-23 DIAGNOSIS — I10 Essential (primary) hypertension: Secondary | ICD-10-CM | POA: Diagnosis not present

## 2024-06-01 DIAGNOSIS — I10 Essential (primary) hypertension: Secondary | ICD-10-CM | POA: Diagnosis not present

## 2024-06-01 DIAGNOSIS — E782 Mixed hyperlipidemia: Secondary | ICD-10-CM | POA: Diagnosis not present

## 2024-06-01 DIAGNOSIS — F439 Reaction to severe stress, unspecified: Secondary | ICD-10-CM | POA: Diagnosis not present

## 2024-06-13 DIAGNOSIS — I10 Essential (primary) hypertension: Secondary | ICD-10-CM | POA: Diagnosis not present

## 2024-06-23 DIAGNOSIS — E782 Mixed hyperlipidemia: Secondary | ICD-10-CM | POA: Diagnosis not present

## 2024-06-23 DIAGNOSIS — I10 Essential (primary) hypertension: Secondary | ICD-10-CM | POA: Diagnosis not present

## 2024-07-13 DIAGNOSIS — I10 Essential (primary) hypertension: Secondary | ICD-10-CM | POA: Diagnosis not present

## 2024-07-24 DIAGNOSIS — E782 Mixed hyperlipidemia: Secondary | ICD-10-CM | POA: Diagnosis not present

## 2024-07-24 DIAGNOSIS — I10 Essential (primary) hypertension: Secondary | ICD-10-CM | POA: Diagnosis not present

## 2024-08-05 DIAGNOSIS — J019 Acute sinusitis, unspecified: Secondary | ICD-10-CM | POA: Diagnosis not present

## 2024-08-12 DIAGNOSIS — I10 Essential (primary) hypertension: Secondary | ICD-10-CM | POA: Diagnosis not present

## 2024-08-23 DIAGNOSIS — E782 Mixed hyperlipidemia: Secondary | ICD-10-CM | POA: Diagnosis not present

## 2024-08-23 DIAGNOSIS — I10 Essential (primary) hypertension: Secondary | ICD-10-CM | POA: Diagnosis not present

## 2024-09-11 DIAGNOSIS — I10 Essential (primary) hypertension: Secondary | ICD-10-CM | POA: Diagnosis not present

## 2024-09-23 DIAGNOSIS — E782 Mixed hyperlipidemia: Secondary | ICD-10-CM | POA: Diagnosis not present

## 2024-09-23 DIAGNOSIS — I10 Essential (primary) hypertension: Secondary | ICD-10-CM | POA: Diagnosis not present

## 2024-09-27 DIAGNOSIS — Z01419 Encounter for gynecological examination (general) (routine) without abnormal findings: Secondary | ICD-10-CM | POA: Diagnosis not present

## 2024-09-27 DIAGNOSIS — I10 Essential (primary) hypertension: Secondary | ICD-10-CM | POA: Diagnosis not present

## 2024-09-27 DIAGNOSIS — Z124 Encounter for screening for malignant neoplasm of cervix: Secondary | ICD-10-CM | POA: Diagnosis not present

## 2024-09-27 DIAGNOSIS — Z9189 Other specified personal risk factors, not elsewhere classified: Secondary | ICD-10-CM | POA: Diagnosis not present

## 2024-10-11 DIAGNOSIS — I10 Essential (primary) hypertension: Secondary | ICD-10-CM | POA: Diagnosis not present

## 2024-10-23 DIAGNOSIS — I10 Essential (primary) hypertension: Secondary | ICD-10-CM | POA: Diagnosis not present

## 2024-10-23 DIAGNOSIS — E782 Mixed hyperlipidemia: Secondary | ICD-10-CM | POA: Diagnosis not present
